# Patient Record
Sex: Female | Born: 1940 | Race: Black or African American | Hispanic: No | Smoking: Never smoker
Health system: Southern US, Community
[De-identification: ages and names within clinical notes are randomized; demographics above are authoritative.]

## PROBLEM LIST (undated history)

## (undated) DIAGNOSIS — I10 Essential (primary) hypertension: Secondary | ICD-10-CM

## (undated) DIAGNOSIS — E7800 Pure hypercholesterolemia, unspecified: Secondary | ICD-10-CM

## (undated) DIAGNOSIS — M1990 Unspecified osteoarthritis, unspecified site: Secondary | ICD-10-CM

## (undated) HISTORY — PX: CORONARY ANGIOPLASTY WITH STENT PLACEMENT: SHX49

## (undated) HISTORY — PX: ABDOMINAL HYSTERECTOMY: SHX81

---

## 2011-11-02 ENCOUNTER — Encounter (HOSPITAL_COMMUNITY): Payer: Self-pay | Admitting: *Deleted

## 2011-11-02 ENCOUNTER — Emergency Department (HOSPITAL_COMMUNITY)
Admission: EM | Admit: 2011-11-02 | Discharge: 2011-11-02 | Disposition: A | Discharge status: Home / Self Care | Payer: PRIVATE HEALTH INSURANCE | Attending: Emergency Medicine | Admitting: Emergency Medicine

## 2011-11-02 DIAGNOSIS — M25539 Pain in unspecified wrist: Secondary | ICD-10-CM | POA: Insufficient documentation

## 2011-11-02 DIAGNOSIS — R609 Edema, unspecified: Secondary | ICD-10-CM | POA: Insufficient documentation

## 2011-11-02 DIAGNOSIS — E7800 Pure hypercholesterolemia, unspecified: Secondary | ICD-10-CM | POA: Insufficient documentation

## 2011-11-02 DIAGNOSIS — Z79899 Other long term (current) drug therapy: Secondary | ICD-10-CM | POA: Insufficient documentation

## 2011-11-02 DIAGNOSIS — R21 Rash and other nonspecific skin eruption: Secondary | ICD-10-CM | POA: Insufficient documentation

## 2011-11-02 DIAGNOSIS — H5710 Ocular pain, unspecified eye: Secondary | ICD-10-CM | POA: Insufficient documentation

## 2011-11-02 DIAGNOSIS — E119 Type 2 diabetes mellitus without complications: Secondary | ICD-10-CM | POA: Insufficient documentation

## 2011-11-02 DIAGNOSIS — I10 Essential (primary) hypertension: Secondary | ICD-10-CM | POA: Insufficient documentation

## 2011-11-02 HISTORY — DX: Pure hypercholesterolemia, unspecified: E78.00

## 2011-11-02 HISTORY — DX: Essential (primary) hypertension: I10

## 2011-11-02 MED ORDER — TRAMADOL HCL 50 MG PO TABS: 50.0000 mg | ORAL_TABLET | Freq: Four times a day (QID) | ORAL | Status: AC | PRN

## 2011-11-02 MED ORDER — HYDROCORTISONE 1 % EX CREA: TOPICAL_CREAM | CUTANEOUS | Status: AC

## 2011-11-02 MED ORDER — POLYMYXIN B-TRIMETHOPRIM 10000-0.1 UNIT/ML-% OP SOLN: 1.0000 [drp] | OPHTHALMIC | Status: AC

## 2011-11-02 NOTE — ED Notes (Addendum)
Pt has rash to left arm, states "it's been 3 weeks." reports rash itches. Also c/o swelling to right wrist and pain. Denies injury, reports had xray on same wrist with no diagnosis. Pt also c/o left eye irritation.

## 2011-11-02 NOTE — ED Provider Notes (Signed)
History     CSN: [id number]  Arrival date & time 11/02/11  1500   First MD Initiated Contact with Patient 11/02/11 1908      Chief Complaint  Patient presents with  . Rash    (Consider location/radiation/quality/duration/timing/severity/associated sxs/prior treatment) HPI Hx from pt. 71 yo F with PMH HTN, DM, hyperlipidemia who presents with several complaints.  She states that she has had a sensation of irritation to her left eye for about the past week and feels as if there is a hair stuck in her eye. She has tried to wash out the eye extensively with water without relief of this sensation. Denies blurry vision, double vision from the eye. Not a contact lens wearer. No known environmental exposures.  She's had a rash to her bilateral arms for the past 3 weeks. Area is itchy. No significant spread since it first appeared. No drainage noted from the areas. No treatment at home prior to arrival. No known aggravating or alleviating factors. Pt does note that this came up after she was out in the yard gardening but is unsure if she came in contact with poison ivy.  Finally, pt states she's had swelling and pain to her R wrist. This has been present for about a month. Swelling located to the radial dorsum. Pain described as aching, worsens with movement. She had an xray done on the wrist while in [place] with family but reports she was not given a diagnosis. Not currently taking any meds at home for this or using ice over the area.   Past Medical History  Diagnosis Date  . Hypertension   . Diabetes mellitus   . Hypercholesteremia     Past Surgical History  Procedure Date  . Abdominal hysterectomy   . Coronary angioplasty with stent placement     History reviewed. No pertinent family history.  History  Substance Use Topics  . Smoking status: Not on file  . Smokeless tobacco: Not on file  . Alcohol Use: No    OB History    Grav Para Term Preterm Abortions TAB SAB Ect Mult  Living                  Review of Systems  HENT: Negative for congestion.   Eyes: Positive for pain. Negative for photophobia, redness and visual disturbance.  Respiratory: Negative for cough and shortness of breath.   Cardiovascular: Negative for chest pain.  Gastrointestinal: Negative for nausea, vomiting and abdominal pain.  Musculoskeletal: Positive for myalgias.  Skin: Positive for rash.  All other systems reviewed and are negative.    Allergies  Review of patient's allergies indicates no known allergies.  Home Medications   Current Outpatient Rx  Name Route Sig Dispense Refill  . AMLODIPINE BESY-BENAZEPRIL HCL 10-20 MG PO CAPS Oral Take 1 capsule by mouth daily.    Marland Kitchen METFORMIN HCL 500 MG PO TABS Oral Take 500 mg by mouth 2 (two) times daily with a meal.    . METOPROLOL SUCCINATE ER 50 MG PO TB24 Oral Take 50 mg by mouth daily. Take with or immediately following a meal.    . SIMVASTATIN 20 MG PO TABS Oral Take 20 mg by mouth daily.      BP 135/82  Pulse 67  Temp 98.2 F (36.8 C) (Oral)  Resp 16  Ht 5\' 3"  (1.6 m)  Wt 180 lb (81.647 kg)  BMI 31.89 kg/m2  SpO2 99%  Physical Exam  Nursing note and vitals reviewed. Constitutional:  She appears well-developed and well-nourished. No distress.  HENT:  Head: Normocephalic and atraumatic.  Right Ear: External ear normal.  Left Ear: External ear normal.  Mouth/Throat: Oropharynx is clear and moist. No oropharyngeal exudate.  Eyes: Conjunctivae and EOM are normal. Pupils are equal, round, and reactive to light.         Increased fluorescein uptake as diagrammed, no obvious FB seen  Neck: Normal range of motion.  Cardiovascular: Normal rate, regular rhythm and normal heart sounds.   Pulmonary/Chest: Effort normal and breath sounds normal. She exhibits no tenderness.  Abdominal: Soft. Bowel sounds are normal. She exhibits no distension. There is no tenderness. There is no rebound and no guarding.  Musculoskeletal: Normal  range of motion.       Mild edema to dorsum of R wrist, area tender to palpation, no deformity, FROM at wrist without pain elicited. Neurovascularly intact with sensory intact to light touch in radian, median, and ulnar distributions. Radial pulse intact. Capillary refill less than 3 seconds.  Neurological: She is alert.  Skin: Skin is warm and dry. Rash noted. Rash is papular. She is not diaphoretic.       Papular rash to bilat forearms, not erythematous appearing, no drainage noted  Psychiatric: She has a normal mood and affect.    ED Course  Procedures (including critical care time)  Labs Reviewed - No data to display No results found.   1. Eye pain   2. Rash   3. Wrist pain       MDM  Pt with multiple complaints - she appears to have a possible small corneal abrasion and has been given polytrim for this. Instructed to f/u with ophtho. Pt also has mild swelling to radial side dorsum of R wrist - possible tendonitis. Given wrist splint. Rash appears poss c/w poison ivy. Will defer systemic pred as pt is diabetic and does not regularly check her sugar. Rx for hydrocortisone. She was instructed to follow up with her primary care doctor for further eval and tx should any of these problems not improve. Reasons to return to ED discussed.        Grant Fontana, PA-C 11/04/11 0130

## 2011-11-02 NOTE — ED Notes (Signed)
Pt verbalizes understanding 

## 2011-11-02 NOTE — Discharge Instructions (Signed)
Your eye pain may be related to a scratch on the surface of your eye. Please take the eye drops as prescribed for the next 5 days.  Your wrist pain is likely related to tendonitis. Please use the wrist splint for comfort and take the pain medication as needed. Follow up with orthopedics if not improving. The rash may be poison ivy. Use the cream for the next several days. Follow up with your doctor if not improving.  RESOURCE GUIDE  Chronic Pain Problems: Contact [facility]  [phone] Patients need to be referred by their primary care doctor.  Insufficient Money for Medicine: Contact United Way:  call "211" or [facility] [phone].  No Primary Care Doctor: - Call [facility]  [phone] - can help you locate a primary care doctor that  accepts your insurance, provides certain services, etc. - Physician Referral Service- [phone]  Agencies that provide inexpensive medical care: - [facility]  [phone] - [facility]  [phone] - [facility]  [phone] - Women's Clinic  [phone] - Planned Parenthood  [phone] [facility]  [phone]  Medicaid-accepting [facility] Providers: - [facility]- [address]  [phone], Mon-Fri 9am-7pm, Sat 9am-1pm - [facility]- [address] , [address] [place]  [phone] - [facility]- [address], [address] [place]  [phone] [facility]- [address]  [phone] - Renaye Rakers- [address] [place], [address], [phone]  Only accepts [place] patients after they have their name  applied to their card  Self Pay (no insurance) in [place]: - Sickle Cell Patients: Dr Willey Blade, [facility]  [address] [place], [phone] - [facility]- [address] [place]  [phone]       [facility]-  [address]       -     [facility]- see information above (Speak to [employer] if you do not have insurance)       -  [facility]- [address] [place], [phone]       -  [facility]- [address],  [phone]       -  [facility]- [address], [phone]       -  Dr Julio Sicks-  [address], [place], [phone]       -  [facility]- [address], [phone]       -  [facility]- [address], [phone], also [address] [phone]       -    [facility]- [address] [place], [phone], 1st & 3rd Saturday   every month, 10am-1pm  1) Find a Doctor and Pay Out of Pocket Although you won't have to find out who is covered by your insurance plan, it is a good idea to ask around and get recommendations. You will then need to call the office and see if the doctor you have chosen will accept you as a new patient and what types of options they offer for patients who are self-pay. Some doctors offer discounts or will set up payment plans for their patients who do not have insurance, but you will need to ask so  you aren't surprised when you get to your appointment.  2) Contact Your Local Health Department Not all health departments have doctors that can see patients for sick visits, but many do, so it is worth a call to see if yours does. If you don't know where your local health department is, you can check in your phone book. The CDC also has a tool to help you locate your state's health department, and many state websites also have listings of all of their local health departments.  3) Find a Walk-in Clinic If your illness is not likely to be very severe or complicated, you may want to try a walk in clinic. These are popping up all over the country in pharmacies, drugstores, and shopping centers. They're usually staffed by nurse practitioners or physician assistants that have been trained  to treat common illnesses and complaints. They're usually fairly quick and inexpensive. However, if you have serious medical issues or chronic medical problems, these are probably not your best option  STD Testing - [facility] Department of [facility], STD Clinic, [address], [place], phone [phone] or [phone].  Monday - Friday, call for an appointment. [facility] Department of [employer], STD Clinic, [address], [place], phone [phone] or [phone].  Monday - Friday, call for an appointment.  Abuse/Neglect: [facility] Child Abuse Hotline 7571361764 [facility] Child Abuse Hotline 770-112-7437 (After Hours)  Emergency Shelter:  [facility] 248-176-5778  Maternity Homes: - [facility] [place] [facility] [phone] - [facility] 8386741888  MRSA Hotline #:   367-252-6293  [facility] Resources  [facility] [place]  [employer] [facility] Dept. [address]                 [address]         [address] [place] [address]  Blondell Reveal Phone:  [phone]                                  Phone:  [phone]                   Phone:  [phone]  [facility] Mental Health, [phone] - [facility] - [phone]       -     [facility] in [place], [address]                                  4587550805, [facility] Child Abuse Hotline 6168325694 or [phone] (After Hours)   Behavioral Health Services  Substance Abuse Resources: - Alcohol and Drug Services  [phone] - [facility] [phone] - [facility] [phone]) [phone] Floydene Flock [phone] - Residential & Outpatient Substance Abuse Program  [phone]  Psychological Services: [facility]  [phone] Services  [phone] - [facility], [place] [address] [place], ACCESS LINE: (828)[phone] or ([phone], [email or web address]  Dental Assistance  If unable to pay or uninsured, contact:  [facility] or [facility]. to become qualified for the adult dental clinic.  Patients with Medicaid: [facility] [phone] [address] Qu[address], [phone] [address], [phone]  If unable to pay, or uninsured, contact [facility] [phone]) or [facility] Department [phone] in [place], [phone] in [facility]) to become qualified for the adult dental clinic  Other Low-Cost Community Dental Services: - [facility]- [address] [place], [place], [place], [address], [phone], Ext. [phone], 2nd and 4th Thursday of the month at 6:30am.  10 clients each day by appointment, can sometimes see walk-in patients if someone does not show for an appointment. [facility]- [address] Ether Griffins [place], [place], [address], [phone] - [facility]- [address], [place], [place], [address], [phone] - [facility]- [phone] [facility]- [phone] [facility] Department- [phone]

## 2011-11-04 NOTE — ED Provider Notes (Signed)
Medical screening examination/treatment/procedure(s) were performed by non-physician practitioner and as supervising physician I was immediately available for consultation/collaboration.  Flint Melter, MD 11/04/11 [id number]

## 2012-03-02 ENCOUNTER — Other Ambulatory Visit: Payer: Self-pay | Admitting: Family Medicine

## 2012-03-02 DIAGNOSIS — N6320 Unspecified lump in the left breast, unspecified quadrant: Secondary | ICD-10-CM

## 2012-03-09 ENCOUNTER — Other Ambulatory Visit: Payer: Self-pay | Admitting: Family Medicine

## 2012-03-09 ENCOUNTER — Ambulatory Visit
Admission: RE | Admit: 2012-03-09 | Discharge: 2012-03-09 | Disposition: A | Discharge status: Home / Self Care | Payer: PRIVATE HEALTH INSURANCE | Source: Ambulatory Visit | Attending: Family Medicine | Admitting: Family Medicine

## 2012-03-09 DIAGNOSIS — N6320 Unspecified lump in the left breast, unspecified quadrant: Secondary | ICD-10-CM

## 2013-10-11 ENCOUNTER — Other Ambulatory Visit: Payer: Self-pay | Admitting: Family Medicine

## 2013-10-11 DIAGNOSIS — M79604 Pain in right leg: Secondary | ICD-10-CM

## 2013-10-11 DIAGNOSIS — M7989 Other specified soft tissue disorders: Secondary | ICD-10-CM

## 2013-10-12 ENCOUNTER — Ambulatory Visit
Admission: RE | Admit: 2013-10-12 | Discharge: 2013-10-12 | Disposition: A | Discharge status: Home / Self Care | Payer: PRIVATE HEALTH INSURANCE | Source: Ambulatory Visit | Attending: Family Medicine | Admitting: Family Medicine

## 2013-10-12 DIAGNOSIS — M79604 Pain in right leg: Secondary | ICD-10-CM

## 2013-10-12 DIAGNOSIS — M7989 Other specified soft tissue disorders: Secondary | ICD-10-CM

## 2013-12-16 ENCOUNTER — Encounter (HOSPITAL_COMMUNITY): Payer: Self-pay | Admitting: Emergency Medicine

## 2013-12-16 ENCOUNTER — Emergency Department (HOSPITAL_COMMUNITY): Payer: PRIVATE HEALTH INSURANCE

## 2013-12-16 ENCOUNTER — Observation Stay (HOSPITAL_COMMUNITY)
Admission: EM | Admit: 2013-12-16 | Discharge: 2013-12-17 | Disposition: A | Discharge status: Home / Self Care | Payer: PRIVATE HEALTH INSURANCE | Attending: Internal Medicine | Admitting: Internal Medicine

## 2013-12-16 DIAGNOSIS — I10 Essential (primary) hypertension: Secondary | ICD-10-CM | POA: Diagnosis not present

## 2013-12-16 DIAGNOSIS — Z79899 Other long term (current) drug therapy: Secondary | ICD-10-CM | POA: Diagnosis not present

## 2013-12-16 DIAGNOSIS — E7800 Pure hypercholesterolemia, unspecified: Secondary | ICD-10-CM | POA: Diagnosis not present

## 2013-12-16 DIAGNOSIS — R079 Chest pain, unspecified: Secondary | ICD-10-CM | POA: Insufficient documentation

## 2013-12-16 DIAGNOSIS — R0789 Other chest pain: Principal | ICD-10-CM | POA: Insufficient documentation

## 2013-12-16 DIAGNOSIS — E119 Type 2 diabetes mellitus without complications: Secondary | ICD-10-CM | POA: Diagnosis not present

## 2013-12-16 DIAGNOSIS — R61 Generalized hyperhidrosis: Secondary | ICD-10-CM | POA: Diagnosis not present

## 2013-12-16 DIAGNOSIS — I2510 Atherosclerotic heart disease of native coronary artery without angina pectoris: Secondary | ICD-10-CM | POA: Diagnosis present

## 2013-12-16 LAB — CBC WITH DIFFERENTIAL/PLATELET
Basophils Absolute: 0 10*3/uL (ref 0.0–0.1)
Basophils Relative: 1 % (ref 0–1)
EOS ABS: 0.1 10*3/uL (ref 0.0–0.7)
Eosinophils Relative: 2 % (ref 0–5)
HCT: 33.7 % — ABNORMAL LOW (ref 36.0–46.0)
HEMOGLOBIN: 11.1 g/dL — AB (ref 12.0–15.0)
LYMPHS ABS: 1.4 10*3/uL (ref 0.7–4.0)
LYMPHS PCT: 22 % (ref 12–46)
MCH: 27.8 pg (ref 26.0–34.0)
MCHC: 32.9 g/dL (ref 30.0–36.0)
MCV: 84.5 fL (ref 78.0–100.0)
MONOS PCT: 5 % (ref 3–12)
Monocytes Absolute: 0.3 10*3/uL (ref 0.1–1.0)
NEUTROS PCT: 70 % (ref 43–77)
Neutro Abs: 4.5 10*3/uL (ref 1.7–7.7)
Platelets: 347 10*3/uL (ref 150–400)
RBC: 3.99 MIL/uL (ref 3.87–5.11)
RDW: 12.6 % (ref 11.5–15.5)
WBC: 6.4 10*3/uL (ref 4.0–10.5)

## 2013-12-16 LAB — GLUCOSE, CAPILLARY
GLUCOSE-CAPILLARY: 170 mg/dL — AB (ref 70–99)
GLUCOSE-CAPILLARY: 73 mg/dL (ref 70–99)
Glucose-Capillary: 99 mg/dL (ref 70–99)

## 2013-12-16 LAB — HEMOGLOBIN A1C
HEMOGLOBIN A1C: 5.2 % (ref <5.7)
MEAN PLASMA GLUCOSE: 103 mg/dL (ref <117)

## 2013-12-16 LAB — I-STAT TROPONIN, ED: TROPONIN I, POC: 0 ng/mL (ref 0.00–0.08)

## 2013-12-16 LAB — TROPONIN I: Troponin I: <0.3 ng/mL (ref <0.30)

## 2013-12-16 LAB — CBC
HCT: 33.7 % — ABNORMAL LOW (ref 36.0–46.0)
HEMOGLOBIN: 11.3 g/dL — AB (ref 12.0–15.0)
MCH: 28.3 pg (ref 26.0–34.0)
MCHC: 33.5 g/dL (ref 30.0–36.0)
MCV: 84.5 fL (ref 78.0–100.0)
Platelets: 335 10*3/uL (ref 150–400)
RBC: 3.99 MIL/uL (ref 3.87–5.11)
RDW: 12.7 % (ref 11.5–15.5)
WBC: 6.5 10*3/uL (ref 4.0–10.5)

## 2013-12-16 LAB — CREATININE, SERUM
Creatinine, Ser: 0.63 mg/dL (ref 0.50–1.10)
GFR calc Af Amer: >90 mL/min (ref >90)
GFR calc non Af Amer: 89 mL/min — ABNORMAL LOW (ref >90)

## 2013-12-16 LAB — BASIC METABOLIC PANEL
Anion gap: 14 (ref 5–15)
BUN: 16 mg/dL (ref 6–23)
CHLORIDE: 103 meq/L (ref 96–112)
CO2: 27 meq/L (ref 19–32)
Calcium: 9.2 mg/dL (ref 8.4–10.5)
Creatinine, Ser: 0.7 mg/dL (ref 0.50–1.10)
GFR calc Af Amer: >90 mL/min (ref >90)
GFR, EST NON AFRICAN AMERICAN: 86 mL/min — AB (ref >90)
GLUCOSE: 98 mg/dL (ref 70–99)
POTASSIUM: 3.6 meq/L — AB (ref 3.7–5.3)
Sodium: 144 mEq/L (ref 137–147)

## 2013-12-16 MED ORDER — ENOXAPARIN SODIUM 40 MG/0.4ML ~~LOC~~ SOLN
40.0000 mg | SUBCUTANEOUS | Status: DC
  Administered 2013-12-16: 40 mg via SUBCUTANEOUS
  Filled 2013-12-16 (×2): qty 0.4

## 2013-12-16 MED ORDER — ASPIRIN 81 MG PO CHEW
324.0000 mg | CHEWABLE_TABLET | Freq: Once | ORAL | Status: AC
  Administered 2013-12-16: 324 mg via ORAL
  Filled 2013-12-16: qty 4

## 2013-12-16 MED ORDER — SIMVASTATIN 20 MG PO TABS
20.0000 mg | ORAL_TABLET | Freq: Every day | ORAL | Status: DC
  Administered 2013-12-16: 20 mg via ORAL
  Filled 2013-12-16 (×2): qty 1

## 2013-12-16 MED ORDER — NITROGLYCERIN 0.4 MG SL SUBL
0.4000 mg | SUBLINGUAL_TABLET | SUBLINGUAL | Status: DC | PRN
  Administered 2013-12-16 (×2): 0.4 mg via SUBLINGUAL
  Filled 2013-12-16: qty 1

## 2013-12-16 MED ORDER — AMLODIPINE BESYLATE 10 MG PO TABS
10.0000 mg | ORAL_TABLET | Freq: Every day | ORAL | Status: DC
  Administered 2013-12-16 – 2013-12-17 (×2): 10 mg via ORAL
  Filled 2013-12-16 (×2): qty 1

## 2013-12-16 MED ORDER — ASPIRIN 325 MG PO TABS
325.0000 mg | ORAL_TABLET | Freq: Every day | ORAL | Status: DC
  Administered 2013-12-17: 325 mg via ORAL
  Filled 2013-12-16: qty 1

## 2013-12-16 MED ORDER — BENAZEPRIL HCL 20 MG PO TABS
20.0000 mg | ORAL_TABLET | Freq: Every day | ORAL | Status: DC
  Administered 2013-12-16 – 2013-12-17 (×2): 20 mg via ORAL
  Filled 2013-12-16 (×2): qty 1

## 2013-12-16 MED ORDER — METOPROLOL SUCCINATE ER 50 MG PO TB24
50.0000 mg | ORAL_TABLET | Freq: Every day | ORAL | Status: DC
  Administered 2013-12-16 – 2013-12-17 (×2): 50 mg via ORAL
  Filled 2013-12-16 (×2): qty 1

## 2013-12-16 MED ORDER — ONDANSETRON HCL 4 MG PO TABS: 4.0000 mg | ORAL_TABLET | Freq: Four times a day (QID) | ORAL | Status: DC | PRN

## 2013-12-16 MED ORDER — AMLODIPINE BESY-BENAZEPRIL HCL 10-20 MG PO CAPS: 1.0000 | ORAL_CAPSULE | Freq: Every day | ORAL | Status: DC

## 2013-12-16 MED ORDER — ACETAMINOPHEN 325 MG PO TABS: 650.0000 mg | ORAL_TABLET | Freq: Four times a day (QID) | ORAL | Status: DC | PRN

## 2013-12-16 MED ORDER — INSULIN ASPART 100 UNIT/ML ~~LOC~~ SOLN
0.0000 [IU] | Freq: Three times a day (TID) | SUBCUTANEOUS | Status: DC
  Administered 2013-12-16: 2 [IU] via SUBCUTANEOUS

## 2013-12-16 MED ORDER — ACETAMINOPHEN 650 MG RE SUPP: 650.0000 mg | Freq: Four times a day (QID) | RECTAL | Status: DC | PRN

## 2013-12-16 MED ORDER — ONDANSETRON HCL 4 MG/2ML IJ SOLN: 4.0000 mg | Freq: Four times a day (QID) | INTRAMUSCULAR | Status: DC | PRN

## 2013-12-16 MED ORDER — SODIUM CHLORIDE 0.9 % IJ SOLN
3.0000 mL | Freq: Two times a day (BID) | INTRAMUSCULAR | Status: DC
  Administered 2013-12-16 (×2): 3 mL via INTRAVENOUS

## 2013-12-16 NOTE — ED Notes (Signed)
Admit Doctor at bedside.  

## 2013-12-16 NOTE — H&P (Signed)
[facility] History and Physical  Kim Bowers ZOX:[id number] DOB: 09/07/1940 DOA: 12/16/2013  Referring physician:  PCP: Leanor Rubenstein, MD  Specialists:   Chief Complaint: chest pain  HPI: Kim Bowers is a 73 y.o. female with PMH of CAD s/p stent, HTN, HPL, DM presented with sudden chest central chest pains, associated with mild SOB, diaphoresis; denies exertional chest pain, or SOB; Pt reported some non productive cough, no fever, chills; no nausea, vomiting or diarrhea   Review of Systems: The patient denies anorexia, fever, weight loss,, vision loss, decreased hearing, hoarseness, chest pain, syncope, dyspnea on exertion, peripheral edema, balance deficits, hemoptysis, abdominal pain, melena, hematochezia, severe indigestion/heartburn, hematuria, incontinence, genital sores, muscle weakness, suspicious skin lesions, transient blindness, difficulty walking, depression, unusual weight change, abnormal bleeding, enlarged lymph nodes, angioedema, and breast masses.    Past Medical History  Diagnosis Date  . Hypertension   . Diabetes mellitus   . Hypercholesteremia    Past Surgical History  Procedure Laterality Date  . Abdominal hysterectomy    . Coronary angioplasty with stent placement     Social History:  reports that she has never smoked. She does not have any smokeless tobacco history on file. She reports that she does not drink alcohol or use illicit drugs. Home;  where does patient live--home, ALF, SNF? and with whom if at home? Yes;  Can patient participate in ADLs?  No Known Allergies  No family history on file.  (be sure to complete)  Prior to Admission medications   Medication Sig Start Date End Date Taking? Authorizing Provider  amLODipine-benazepril (LOTREL) 10-20 MG per capsule Take 1 capsule by mouth daily.   Yes Historical Provider, MD  meloxicam (MOBIC) 15 MG tablet Take 15 mg by mouth daily as needed for pain.   Yes Historical Provider, MD  metFORMIN  (GLUCOPHAGE-XR) 500 MG 24 hr tablet Take 500 mg by mouth 2 (two) times daily.   Yes Historical Provider, MD  metoprolol succinate (TOPROL-XL) 50 MG 24 hr tablet Take 50 mg by mouth daily. Take with or immediately following a meal.   Yes Historical Provider, MD  simvastatin (ZOCOR) 20 MG tablet Take 20 mg by mouth daily.   Yes Historical Provider, MD   Physical Exam: Filed Vitals:   12/16/13 1245  BP: 127/75  Pulse: 64  Temp:   Resp: 16     General:  alert  Eyes: eom-i, perrla   ENT: no oral ulcers   Neck: supple, no JVD   Cardiovascular: s1,s2 rrr  Respiratory: CTA BL  Abdomen: soft, nt,nd   Skin: no rash   Musculoskeletal: no LE edema  Psychiatric: no hallucinations   Neurologic: CN 2-12 intact; motor 5/5 BL  Labs on Admission:  Basic Metabolic Panel:  Recent Labs Lab 12/16/13 1054  NA 144  K 3.6*  CL 103  CO2 27  GLUCOSE 98  BUN 16  CREATININE 0.70  CALCIUM 9.2   Liver Function Tests: No results found for this basename: AST, ALT, ALKPHOS, BILITOT, PROT, ALBUMIN,  in the last 168 hours No results found for this basename: LIPASE, AMYLASE,  in the last 168 hours No results found for this basename: AMMONIA,  in the last 168 hours CBC:  Recent Labs Lab 12/16/13 1054  WBC 6.4  NEUTROABS 4.5  HGB 11.1*  HCT 33.7*  MCV 84.5  PLT 347   Cardiac Enzymes: No results found for this basename: CKTOTAL, CKMB, CKMBINDEX, TROPONINI,  in the last 168 hours  BNP (last 3  results) No results found for this basename: PROBNP,  in the last 8760 hours CBG: No results found for this basename: GLUCAP,  in the last 168 hours  Radiological Exams on Admission: Dg Chest 2 View  12/16/2013   CLINICAL DATA:  Chest pain.  EXAM: CHEST  2 VIEW  COMPARISON:-: COMPARISON:- None available.  FINDINGS: The heart size is normal. The lungs are clear. Mild bronchitic changes are present bilaterally. The visualized soft tissues and bony thorax are unremarkable.  IMPRESSION: Mild  central airway thickening and bronchitic changes bilaterally. May related to reactive airways disease. Nonspecific bronchitis also considered. No focal airspace consolidation is evident.   Electronically Signed   By: Gennette Pachris  Mattern M.D.   On: 12/16/2013 12:06    EKG: Independently reviewed.   Assessment/Plan Active Problems:   Chest pain   CAD (coronary artery disease)   HTN (hypertension)  73 y.o. female with PMH of CAD s/p stent, HTN, HPL, DM presented with sudden chest central chest pains, associated with mild SOB, diaphoresis;  1. Chest pain, atypical; +chest wall tenderness; but significant h/o CAD; no chest pain  -initial trop negative; no previous ECG; monitor serial trop, ECG; start ASA; cont BB, statin;  -consulted cardiology for stress test eval   2. CAD s/p stent history>10 yrs ago; cont BB, statin, added ASA 3. DM no recent ha1c; cont ISS: obtain a1c 4. HTN, cont home regimen   Cardiology;  if consultant consulted, please document name and whether formally or informally consulted  Code Status: full (must indicate code status--if unknown or must be presumed, indicate so) Family Communication:  D/w patient  (indicate person spoken with, if applicable, with phone number if by telephone) Disposition Plan: home 24-48 hrs  (indicate anticipated LOS)  Time spent: >35 minutes   Esperanza SheetsBURIEV, Labaron Digirolamo N [facility] Pager [phone]  If 7PM-7AM, please contact night-coverage [email or web address] Password [email or web address] 12/16/2013, 1:14 PM

## 2013-12-16 NOTE — ED Notes (Signed)
Pt returned from xray

## 2013-12-16 NOTE — ED Notes (Signed)
Lt. Side chest pain began yesterday, intermittent and sharp.  When the pain would come pt. Was unable to move.  Denies any n/v/ sob or dizziness.  Skin is w/d, resp. E/u

## 2013-12-16 NOTE — ED Notes (Signed)
Patient denies pain 0/10 however states left sided chest heavy feeling 2/10. Denies shortness of breath.

## 2013-12-16 NOTE — ED Notes (Signed)
Pt transported to X-Ray with X-ray tech.

## 2013-12-16 NOTE — ED Provider Notes (Signed)
TIME SEEN: 10:41 AM  CHIEF COMPLAINT: Chest pain, diaphoresis  HPI: Patient is a 73 year old female with history of hypertension, diabetes, hyperlipidemia, CAD [place] stent [place] 2001 [place] who presents emergency department with left-sided chest pain that has been intermittent for several months but worse over the past several days. She describes as a heaviness in her chest. She states she's also had one week of diaphoresis with exertion and now at rest. She states when she had to have a stent placed she was having diaphoresis with exertion and no other symptoms. She denies any shortness of breath, nausea, vomiting or diarrhea, fever or cough. She [place]ologist [place]. Her PCP is Dr. Wynelle LinkSun.  ROS: See HPI Constitutional: no fever  Eyes: no drainage  ENT: no runny nose   Cardiovascular:   chest pain  Resp: no SOB  GI: no vomiting GU: no dysuria Integumentary: no rash  Allergy: no hives  Musculoskeletal: no leg swelling  Neurological: no slurred speech ROS otherwise negative  PAST MEDICAL HISTORY/PAST SURGICAL HISTORY:  Past Medical History  Diagnosis Date  . Hypertension   . Diabetes mellitus   . Hypercholesteremia     MEDICATIONS:  Prior to Admission medications   Medication Sig Start Date End Date Taking? Authorizing Provider  amLODipine-benazepril (LOTREL) 10-20 MG per capsule Take 1 capsule by mouth daily.   Yes Historical Provider, MD  meloxicam (MOBIC) 15 MG tablet Take 15 mg by mouth daily as needed for pain.   Yes Historical Provider, MD  metFORMIN (GLUCOPHAGE-XR) 500 MG 24 hr tablet Take 500 mg by mouth 2 (two) times daily.   Yes Historical Provider, MD  metoprolol succinate (TOPROL-XL) 50 MG 24 hr tablet Take 50 mg by mouth daily. Take with or immediately following a meal.   Yes Historical Provider, MD  simvastatin (ZOCOR) 20 MG tablet Take 20 mg by mouth daily.   Yes Historical Provider, MD    ALLERGIES:  No Known Allergies  SOCIAL  HISTORY:  History  Substance Use Topics  . Smoking status: Never Smoker   . Smokeless tobacco: Not on file  . Alcohol Use: No    FAMILY HISTORY: No family history on file.  EXAM: BP 179/84  Pulse 60  Temp(Src) 97.9 F (36.6 C) (Oral)  Resp 16  Ht 5\' 3"  (1.6 m)  Wt 165 lb (74.844 kg)  BMI 29.24 kg/m2  SpO2 100% CONSTITUTIONAL: Alert and oriented and responds appropriately to questions. Well-appearing; well-nourished HEAD: Normocephalic EYES: Conjunctivae clear, PERRL ENT: normal nose; no rhinorrhea; moist mucous membranes; pharynx without lesions noted NECK: Supple, no meningismus, no LAD  CARD: RRR; S1 and S2 appreciated; no murmurs, no clicks, no rubs, no gallops RESP: Normal chest excursion without splinting or tachypnea; breath sounds clear and equal bilaterally; no wheezes, no rhonchi, no rales,  ABD/GI: Normal bowel sounds; non-distended; soft, non-tender, no rebound, no guarding BACK:  The back appears normal and is non-tender to palpation, there is no CVA tenderness EXT: Normal ROM in all joints; non-tender to palpation; no edema; normal capillary refill; no cyanosis    SKIN: Normal color for age and race; warm NEURO: Moves all extremities equally PSYCH: The patient's mood and manner are appropriate. Grooming and personal hygiene are appropriate.  MEDICAL DECISION MAKING: Patient here with chest pain and diaphoresis. She is a very poor historian and history is limited. Concerned however because patient has a history of CAD and states that when she had her prior stent  place her only symptom was diaphoresis. Given she is having similar symptoms this week, he feel she needs evaluation for ACS. We'll give aspirin, nitroglycerin, obtain cardiac labs and chest x-ray. Her EKG shows Q waves in anterior leads but there is no old for comparison. I feel she will need admission for ACS rule out.  ED PROGRESS: Troponin negative. Chest x-ray clear. Discussed with hospitalist for  admission for ACS rule out to observation, telemetry bed.     EKG Interpretation  Date/Time:  Saturday December 16 2013 09:48:45 EDT Ventricular Rate:  60 PR Interval:  150 QRS Duration: 80 QT Interval:  416 QTC Calculation: 416 R Axis:   19 Text Interpretation:  Sinus rhythm with occasional Premature ventricular complexes Cannot rule out Anterior infarct , age undetermined Abnormal ECG No old for comparison Confirmed by WARD,  DO, KRISTEN [phone]) on 12/16/2013 10:45:10 AM        Layla Maw Ward, DO 12/16/13 1228

## 2013-12-17 DIAGNOSIS — I10 Essential (primary) hypertension: Secondary | ICD-10-CM | POA: Diagnosis not present

## 2013-12-17 DIAGNOSIS — R079 Chest pain, unspecified: Secondary | ICD-10-CM

## 2013-12-17 DIAGNOSIS — R61 Generalized hyperhidrosis: Secondary | ICD-10-CM | POA: Diagnosis not present

## 2013-12-17 DIAGNOSIS — R0789 Other chest pain: Secondary | ICD-10-CM | POA: Diagnosis not present

## 2013-12-17 LAB — GLUCOSE, CAPILLARY
GLUCOSE-CAPILLARY: 95 mg/dL (ref 70–99)
Glucose-Capillary: 94 mg/dL (ref 70–99)

## 2013-12-17 LAB — TROPONIN I: Troponin I: <0.3 ng/mL (ref <0.30)

## 2013-12-17 MED ORDER — SIMVASTATIN 20 MG PO TABS: 20.0000 mg | ORAL_TABLET | Freq: Every day | ORAL | Status: AC

## 2013-12-17 MED ORDER — AMLODIPINE BESY-BENAZEPRIL HCL 10-20 MG PO CAPS: 1.0000 | ORAL_CAPSULE | Freq: Every day | ORAL | Status: AC

## 2013-12-17 MED ORDER — ACETAMINOPHEN 325 MG PO TABS: 650.0000 mg | ORAL_TABLET | Freq: Four times a day (QID) | ORAL | Status: AC | PRN

## 2013-12-17 MED ORDER — METOPROLOL SUCCINATE ER 50 MG PO TB24: 50.0000 mg | ORAL_TABLET | Freq: Every day | ORAL | Status: AC

## 2013-12-17 MED ORDER — ASPIRIN EC 81 MG PO TBEC: 81.0000 mg | DELAYED_RELEASE_TABLET | Freq: Every day | ORAL | Status: AC

## 2013-12-17 MED ORDER — METFORMIN HCL ER 500 MG PO TB24: 500.0000 mg | ORAL_TABLET | Freq: Two times a day (BID) | ORAL | Status: DC

## 2013-12-17 NOTE — Progress Notes (Signed)
UR Completed.  Adel Burch Jane [phone] 12/17/2013  

## 2013-12-17 NOTE — Discharge Instructions (Signed)
Please follow up with primary care doctor in 1 week to scheduled stress test  Please follow up with cardiology in 1 week

## 2013-12-17 NOTE — Discharge Summary (Signed)
Physician Discharge Summary  Kim Bowers ZOX:[id number] DOB: Apr 21, 1941 DOA: 12/16/2013  PCP: Leanor Rubenstein, MD  Admit date: 12/16/2013 Discharge date: 12/17/2013  Time spent: >35 minutes  Recommendations for Outpatient Follow-up:  F/u with PCP in 1 week  F/u with cardiology in 1 week to scheduled stress test   Discharge Diagnoses:  Active Problems:   Chest pain   CAD (coronary artery disease)   HTN (hypertension)   Discharge Condition: stable   Diet recommendation: low sodium, DM  Filed Weights   12/16/13 0954  Weight: 74.844 kg (165 lb)    History of present illness:  73 y.o. female with PMH of CAD s/p stent, HTN, HPL, DM presented with sudden chest central chest pains, associated with mild SOB, diaphoresis;    Hospital Course:  1. Chest pain, atypical; +chest wall tenderness;  -chest pain resolved; trop negative; ACS ruled out; started ASA; cont BB, statin;  -consulted cardiology: recommended OP stress test; d/w patient  2. CAD s/p stent history>10 yrs ago; cont BB, statin, added ASA  3. DM no recent ha1c; cont ISS: obtain a1c -5.2 4. HTN, cont home regimen   Procedures:  none (i.e. Studies not automatically included, echos, thoracentesis, etc; not x-rays)  Consultations:  Cardiology   Discharge Exam: Filed Vitals:   12/17/13 0548  BP: 115/64  Pulse: 53  Temp: 97.9 F (36.6 C)  Resp: 15    General: alert Cardiovascular: s1,s2 rrr Respiratory: CTA BL  Discharge Instructions  Discharge Instructions   Diet - low sodium heart healthy    Complete by:  As directed      Discharge instructions    Complete by:  As directed   Please follow up with primary care doctor in 1 week to scheduled stress test  Please follow up with cardiology in 1 week     Increase activity slowly    Complete by:  As directed             Medication List         acetaminophen 325 MG tablet  Commonly known as:  TYLENOL  Take 2 tablets (650 mg total) by mouth every 6 (six)  hours as needed for mild pain (or Fever >/= 101).     amLODipine-benazepril 10-20 MG per capsule  Commonly known as:  LOTREL  Take 1 capsule by mouth daily.     aspirin EC 81 MG tablet  Take 1 tablet (81 mg total) by mouth daily.     meloxicam 15 MG tablet  Commonly known as:  MOBIC  Take 15 mg by mouth daily as needed for pain.     metFORMIN 500 MG 24 hr tablet  Commonly known as:  GLUCOPHAGE-XR  Take 1 tablet (500 mg total) by mouth 2 (two) times daily.     metoprolol succinate 50 MG 24 hr tablet  Commonly known as:  TOPROL-XL  Take 1 tablet (50 mg total) by mouth daily. Take with or immediately following a meal.     simvastatin 20 MG tablet  Commonly known as:  ZOCOR  Take 1 tablet (20 mg total) by mouth daily.       No Known Allergies     Follow-up Information   Follow up with Leanor Rubenstein, MD. Schedule an appointment as soon as possible for a visit in 1 week.   Specialty:  Family Medicine   Contact information:   [address] [place] [address] [phone]       Follow up  with Cassell Clementhomas Brackbill, MD. Schedule an appointment as soon as possible for a visit in 1 week.   Specialty:  Cardiology   Contact information:   [address] [place] [address] [phone]        The results of significant diagnostics from this hospitalization (including imaging, microbiology, ancillary and laboratory) are listed below for reference.    Significant Diagnostic Studies: Dg Chest 2 View  12/16/2013   CLINICAL DATA:  Chest pain.  EXAM: CHEST  2 VIEW  COMPARISON:-: COMPARISON:- None available.  FINDINGS: The heart size is normal. The lungs are clear. Mild bronchitic changes are present bilaterally. The visualized soft tissues and bony thorax are unremarkable.  IMPRESSION: Mild central airway thickening and bronchitic changes bilaterally. May related to reactive airways disease. Nonspecific bronchitis also considered. No focal airspace  consolidation is evident.   Electronically Signed   By: Gennette Pachris  Mattern M.D.   On: 12/16/2013 12:06    Microbiology: No results found for this or any previous visit (from the past 240 hour(s)).   Labs: Basic Metabolic Panel:  Recent Labs Lab 12/16/13 1054 12/16/13 1405  NA 144  --   K 3.6*  --   CL 103  --   CO2 27  --   GLUCOSE 98  --   BUN 16  --   CREATININE 0.70 0.63  CALCIUM 9.2  --    Liver Function Tests: No results found for this basename: AST, ALT, ALKPHOS, BILITOT, PROT, ALBUMIN,  in the last 168 hours No results found for this basename: LIPASE, AMYLASE,  in the last 168 hours No results found for this basename: AMMONIA,  in the last 168 hours CBC:  Recent Labs Lab 12/16/13 1054 12/16/13 1405  WBC 6.4 6.5  NEUTROABS 4.5  --   HGB 11.1* 11.3*  HCT 33.7* 33.7*  MCV 84.5 84.5  PLT 347 335   Cardiac Enzymes:  Recent Labs Lab 12/16/13 1405 12/16/13 1825 12/17/13 0128  TROPONINI <0.30 <0.30 <0.30   BNP: BNP (last 3 results) No results found for this basename: PROBNP,  in the last 8760 hours CBG:  Recent Labs Lab 12/16/13 1337 12/16/13 1658 12/16/13 2020 12/17/13 0710  GLUCAP 99 170* 73 95       Signed:  Ameliah Baskins N  [facility] 12/17/2013, 11:19 AM

## 2013-12-17 NOTE — Consult Note (Signed)
CARDIOLOGY CONSULT NOTE   Patient ID: Kim Bowers MRN: [id number], DOB/AGE: 01/04/1941   Admit date: 12/16/2013 Date of Consult: 12/17/2013   Primary Physician: Leanor RubensteinSUN,VYVYAN Y, MD Primary Cardiologist: None  Pt. Profile  73 year old woman with hypertension hypercholesterol and diabetes admitted with chest pain.  Problem List  Past Medical History  Diagnosis Date  . Hypertension   . Diabetes mellitus   . Hypercholesteremia     Past Surgical History  Procedure Laterality Date  . Abdominal hysterectomy    . Coronary angioplasty with stent placement       Allergies  No Known Allergies  HPI   This pleasant 73 year old woman was admitted on 12/16/13 with chest pain.  She has a past history of coronary artery disease.  She had a stent to her heart [facility] [place] [facility] [place] about 13 years ago.  Since then she has not been experiencing any exertional chest pain or angina.  She is on disability for her medical problems.  She has been taking her medications as directed.  Inpatient Medications  . amLODipine  10 mg Oral Daily   And  . benazepril  20 mg Oral Daily  . aspirin  325 mg Oral Daily  . enoxaparin (LOVENOX) injection  40 mg Subcutaneous Q24H  . insulin aspart  0-9 Units Subcutaneous TID WC  . metoprolol succinate  50 mg Oral Daily  . simvastatin  20 mg Oral q1800  . sodium chloride  3 mL Intravenous Q12H    Family History No family history on file.  The patient's mother died of a heart attack in her 6850s.   Social History History   Social History  . Marital Status: Widowed    Spouse Name: N/A    Number of Children: N/A  . Years of Education: N/A   Occupational History  . Not on file.   Social History Main Topics  . Smoking status: Never Smoker   . Smokeless tobacco: Not on file  . Alcohol Use: No  . Drug Use: No  . Sexual Activity: Not on file   Other Topics Concern  . Not on file   Social History Narrative  . No  narrative on file     Review of Systems  General:  No chills, fever, night sweats or weight changes.  Cardiovascular:  No chest pain, dyspnea on exertion, edema, orthopnea, palpitations, paroxysmal nocturnal dyspnea. Dermatological: No rash, lesions/masses Respiratory: No cough, dyspnea Urologic: No hematuria, dysuria Abdominal:   No nausea, vomiting, diarrhea, bright red blood per rectum, melena, or hematemesis Neurologic:  No visual changes, wkns, changes in mental status. All other systems reviewed and are otherwise negative except as noted above.  Physical Exam  Blood pressure 115/64, pulse 53, temperature 97.9 F (36.6 C), temperature source Oral, resp. rate 15, height 5\' 3"  (1.6 m), weight 165 lb (74.844 kg), SpO2 100.00%.  General: Pleasant, NAD Psych: Normal affect. Neuro: Alert and oriented X 3. Moves all extremities spontaneously. HEENT: Normal  Neck: Supple without bruits or JVD. Lungs:  Resp regular and unlabored, CTA. Heart: RRR no s3, s4, or murmurs. Abdomen: Soft, non-tender, non-distended, BS + x 4.  Extremities: No clubbing, cyanosis or edema. DP/PT/Radials 2+ and equal bilaterally.  Labs   Recent Labs  12/16/13 1405 12/16/13 1825 12/17/13 0128  TROPONINI <0.30 <0.30 <0.30   Lab Results  Component Value Date   WBC 6.5 12/16/2013   HGB 11.3* 12/16/2013   HCT 33.7* 12/16/2013   MCV  84.5 12/16/2013   PLT 335 12/16/2013     Recent Labs Lab 12/16/13 1054 12/16/13 1405  NA 144  --   K 3.6*  --   CL 103  --   CO2 27  --   BUN 16  --   CREATININE 0.70 0.63  CALCIUM 9.2  --   GLUCOSE 98  --    No results found for this basename: CHOL,  HDL,  LDLCALC,  TRIG   No results found for this basename: DDIMER    Radiology/Studies  Dg Chest 2 View  12/16/2013   CLINICAL DATA:  Chest pain.  EXAM: CHEST  2 VIEW  COMPARISON:-: COMPARISON:- None available.  FINDINGS: The heart size is normal. The lungs are clear. Mild bronchitic changes are present bilaterally. The  visualized soft tissues and bony thorax are unremarkable.  IMPRESSION: Mild central airway thickening and bronchitic changes bilaterally. May related to reactive airways disease. Nonspecific bronchitis also considered. No focal airspace consolidation is evident.   Electronically Signed   By: Gennette Pac M.D.   On: 12/16/2013 12:06    ECG  Normal sinus rhythm  ASSESSMENT AND PLAN  1. chest pain.  She has ruled out for myocardial infarction.  Enzymes are negative and EKG is normal.  She has a remote history of coronary artery disease with previous stent, details not known. 2. hypertensive heart disease without heart failure 3. Hypercholesterolemia 4. diabetes mellitus adult onset 5. osteoarthritis of the right knee  Disposition: Nuclear medicine is unable to get her on the schedule for today.  She would benefit from a Lexi scan Myoview stress.  This can be done on an outpatient basis at our office. Okay for discharge home later today from cardiology standpoint. Signed, Cassell Clement, MD  12/17/2013, 10:29 AM

## 2014-01-29 ENCOUNTER — Ambulatory Visit (INDEPENDENT_AMBULATORY_CARE_PROVIDER_SITE_OTHER): Payer: PRIVATE HEALTH INSURANCE | Admitting: Cardiology

## 2014-01-29 ENCOUNTER — Encounter: Payer: Self-pay | Admitting: Cardiology

## 2014-01-29 VITALS — BP 130/84 | HR 62 | Ht 63.0 in | Wt 173.0 lb

## 2014-01-29 DIAGNOSIS — E7800 Pure hypercholesterolemia, unspecified: Secondary | ICD-10-CM

## 2014-01-29 DIAGNOSIS — IMO0001 Reserved for inherently not codable concepts without codable children: Secondary | ICD-10-CM | POA: Insufficient documentation

## 2014-01-29 DIAGNOSIS — I119 Hypertensive heart disease without heart failure: Secondary | ICD-10-CM

## 2014-01-29 DIAGNOSIS — E1165 Type 2 diabetes mellitus with hyperglycemia: Principal | ICD-10-CM

## 2014-01-29 DIAGNOSIS — I259 Chronic ischemic heart disease, unspecified: Secondary | ICD-10-CM | POA: Insufficient documentation

## 2014-01-29 DIAGNOSIS — M1710 Unilateral primary osteoarthritis, unspecified knee: Secondary | ICD-10-CM | POA: Insufficient documentation

## 2014-01-29 NOTE — Progress Notes (Signed)
Kim Bowers Date of Birth:  01-15-1941 [facility] [address] [place], [place]  [address] [phone]        Fax   [phone]   History of Present Illness: This pleasant 73 year old woman is seen for a posthospital office visit.  We had seen her in consultation during her hospital stay of August 2015.  She was admitted at that time with chest pain.  She ruled out for a myocardial infarction.  Myoview stress test was planned but could not be done on an inpatient basis and was therefore felt to be indicated to be done subsequently on an outpatient basis.  Patient has a history of remote coronary artery disease.  She had a previous stent placed in her heart at [facility] in [place] about 13 years ago.  She has a past history of hypertension hypercholesterolemia and she has been diabetic for about 15 years.  She has never smoked cigarettes. She is on disability for her medical problems. Recently she has been having worsening problems with right knee pain.  She has seen an orthopedist here in [place] but cannot remember whom she saw.  Her medical doctor is Dr. Wynelle Link. History reveals that her mother died of a heart attack in her 45s.  Current Outpatient Prescriptions  Medication Sig Dispense Refill  . acetaminophen (TYLENOL) 325 MG tablet Take 2 tablets (650 mg total) by mouth every 6 (six) hours as needed for mild pain (or Fever >/= 101).      Marland Kitchen amLODipine-benazepril (LOTREL) 10-20 MG per capsule Take 1 capsule by mouth daily.  30 capsule  1  . aspirin EC 81 MG tablet Take 1 tablet (81 mg total) by mouth daily.      . metFORMIN (GLUCOPHAGE-XR) 500 MG 24 hr tablet Take 500 mg by mouth daily with breakfast.      . metoprolol succinate (TOPROL-XL) 50 MG 24 hr tablet Take 1 tablet (50 mg total) by mouth daily. Take with or immediately following a meal.  30 tablet  1  . simvastatin (ZOCOR) 20 MG tablet Take 1 tablet (20 mg total) by mouth daily.   30 tablet  1   No current facility-administered medications for this visit.    No Known Allergies  Patient Active Problem List   Diagnosis Date Noted  . Type II or unspecified type diabetes mellitus without mention of complication, uncontrolled 01/29/2014  . Pure hypercholesterolemia 01/29/2014  . Ischemic heart disease 01/29/2014  . Osteoarthritis of knee, unilateral 01/29/2014  . Chest pain 12/16/2013  . CAD (coronary artery disease) 12/16/2013  . HTN (hypertension) 12/16/2013    History  Smoking status  . Never Smoker   Smokeless tobacco  . Not on file    History  Alcohol Use No    No family history on file.mother died of a heart attack in her 43s.  Review of Systems: Constitutional: no fever chills diaphoresis or fatigue or change in weight.  Head and neck: no hearing loss, no epistaxis, no photophobia or visual disturbance. Respiratory: No cough, shortness of breath or wheezing. Cardiovascular: No chest pain peripheral edema, palpitations. Gastrointestinal: No abdominal distention, no abdominal pain, no change in bowel habits hematochezia or melena. Genitourinary: No dysuria, no frequency, no urgency, no nocturia. Musculoskeletal:No arthralgias, no back pain, no gait disturbance or myalgias.  Recent severe right knee swelling and pain  Neurological: No dizziness, no headaches, no numbness, no seizures, no syncope, no weakness, no tremors. Hematologic:  No lymphadenopathy, no easy bruising. Psychiatric: No confusion, no hallucinations, no sleep disturbance.    Physical Exam: Filed Vitals:   01/29/14 1322  BP: 130/84  Pulse: 62  The patient appears to be in no distress.  Head and neck exam reveals that the pupils are equal and reactive.  The extraocular movements are full.  There is no scleral icterus.  Mouth and pharynx are benign.  No lymphadenopathy.  No carotid bruits.  The jugular venous pressure is normal.  Thyroid is not enlarged or tender.  Chest is  clear to percussion and auscultation.  No rales or rhonchi.  Expansion of the chest is symmetrical.  Heart reveals no abnormal lift or heave.  First and second heart sounds are normal.  There is no murmur gallop rub or click.  The abdomen is soft and nontender.  Bowel sounds are normoactive.  There is no hepatosplenomegaly or mass.  There are no abdominal bruits.  Extremities reveal no phlebitis or edema.  Pedal pulses are good.  There is no cyanosis or clubbing.  The right knee is swollen and tender.  Neurologic exam is normal strength and no lateralizing weakness.  No sensory deficits.  Integument reveals no rash  EKG shows normal sinus rhythm with minimal voltage criteria for LVH.  Since 12/16/13, there is no significant change  Assessment / Plan: 1. ischemic heart disease with history of stent to coronary artery about 13 years ago in [place].  Details not available. 2. chest pain, atypical, with myocardial infarction ruled out at recent hospital admission in August 2015 3. hypertensive heart disease without heart failure 4. Hypercholesterolemia 5.  Diabetes mellitus 6.  Osteoarthritis of right knee  Plan: return for a LexiScan Myoview stress test.  Followup with her primary care physician or her orthopedist regarding her ongoing right knee.  Return here when necessary. Many thanks for the opportunity to see this pleasant woman with you.  We will be in touch regarding the results of her stress test.

## 2014-01-29 NOTE — Patient Instructions (Signed)
Your physician recommends that you continue on your current medications as directed. Please refer to the Current Medication list given to you today.  Your physician has requested that you have a lexiscan myoview. For further information please visit [email or web address]. Please follow instruction sheet, as given.  Follow up as needed  

## 2014-02-07 ENCOUNTER — Ambulatory Visit (HOSPITAL_COMMUNITY): Payer: PRIVATE HEALTH INSURANCE | Attending: Cardiovascular Disease | Admitting: Radiology

## 2014-02-07 VITALS — BP 147/78 | Ht 63.0 in | Wt 170.0 lb

## 2014-02-07 DIAGNOSIS — R079 Chest pain, unspecified: Secondary | ICD-10-CM | POA: Insufficient documentation

## 2014-02-07 DIAGNOSIS — I10 Essential (primary) hypertension: Secondary | ICD-10-CM | POA: Insufficient documentation

## 2014-02-07 DIAGNOSIS — E1165 Type 2 diabetes mellitus with hyperglycemia: Secondary | ICD-10-CM

## 2014-02-07 DIAGNOSIS — E119 Type 2 diabetes mellitus without complications: Secondary | ICD-10-CM | POA: Diagnosis not present

## 2014-02-07 DIAGNOSIS — I119 Hypertensive heart disease without heart failure: Secondary | ICD-10-CM

## 2014-02-07 DIAGNOSIS — I2510 Atherosclerotic heart disease of native coronary artery without angina pectoris: Secondary | ICD-10-CM

## 2014-02-07 DIAGNOSIS — IMO0001 Reserved for inherently not codable concepts without codable children: Secondary | ICD-10-CM

## 2014-02-07 DIAGNOSIS — I259 Chronic ischemic heart disease, unspecified: Secondary | ICD-10-CM

## 2014-02-07 DIAGNOSIS — E7800 Pure hypercholesterolemia, unspecified: Secondary | ICD-10-CM

## 2014-02-07 MED ORDER — REGADENOSON 0.4 MG/5ML IV SOLN
0.4000 mg | Freq: Once | INTRAVENOUS | Status: AC
  Administered 2014-02-07: 0.4 mg via INTRAVENOUS

## 2014-02-07 MED ORDER — TECHNETIUM TC 99M SESTAMIBI GENERIC - CARDIOLITE
10.0000 | Freq: Once | INTRAVENOUS | Status: AC | PRN
  Administered 2014-02-07: 10 via INTRAVENOUS

## 2014-02-07 MED ORDER — TECHNETIUM TC 99M SESTAMIBI GENERIC - CARDIOLITE
30.0000 | Freq: Once | INTRAVENOUS | Status: AC | PRN
  Administered 2014-02-07: 30 via INTRAVENOUS

## 2014-02-07 NOTE — Progress Notes (Signed)
[facility] S[facility] NUCLEAR MED [address] [place], [place] [address] [phone]    Cardiology Nuclear Med Study  Jacqulyn CaneMattie Willa RoughHicks is a 73 y.o. female     MRN : [id number]     DOB: 1940/06/10  Procedure Date: 02/07/2014  Nuclear Med Background Indication for Stress Test:  Evaluation for Ischemia and Stent Patency History:  CAD; Cardiac Risk Factors: Hypertension and NIDDM  Symptoms:  Chest Pain and "heaviness under left breast" last episode this am   Nuclear Pre-Procedure Caffeine/Decaff Intake:  None> 12 hrs NPO After: 10:00am   Lungs:  clear O2 Sat: 96% on room air. IV 0.9% NS with Angio Cath:  22g  IV Site: R Antecubital x 1, tolerated well IV Started by:  Irean HongPatsy Edwards, RN  Chest Size (in):  36 Cup Size: C  Height: 5\' 3"  (1.6 m)  Weight:  170 lb (77.111 kg)  BMI:  Body mass index is 30.12 kg/(m^2). Tech Comments:  No metformin today, and held Toprol x 48 hrs per patient. Irean HongPatsy Edwards, RN.    Nuclear Med Study 1 or 2 day study: 1 day  Stress Test Type:  Eugenie BirksLexiscan  Reading MD: N/A  Order Authorizing Provider:  Cassell Clementhomas Brackbill, MD  Resting Radionuclide: Technetium 7130m Sestamibi  Resting Radionuclide Dose: 11.0 mCi   Stress Radionuclide:  Technetium 4230m Sestamibi  Stress Radionuclide Dose: 33.0 mCi           Stress Protocol Rest HR: 57 Stress HR: 85  Rest BP: 147/78 Stress BP: 218/73  Exercise Time (min): n/a METS: n/a   Predicted Max HR: 150 bpm % Max HR: 56.67 bpm Rate Pressure Product: [id number]   Dose of Adenosine (mg):  n/a Dose of Lexiscan: 0.4 mg  Dose of Atropine (mg): n/a Dose of Dobutamine: n/a mcg/kg/min (at max HR)  Stress Test Technologist: Frederick Peerseresa Johnson, EMT-P  Nuclear Technologist:  Kerby NoraElzbieta Kubak, CNMT     Rest Procedure:  Myocardial perfusion imaging was performed at rest 45 minutes following the intravenous administration of Technetium 6430m Sestamibi. Rest ECG: NSR - Normal EKG  Stress Procedure:  The patient received IV Lexiscan 0.4  mg over 15-seconds.  Technetium 5030m Sestamibi injected at 30-seconds.  Quantitative spect images were obtained after a 45 minute delay. Stress ECG: No significant change from baseline ECG, PVCs  QPS Raw Data Images:  Normal; no motion artifact; normal heart/lung ratio. Stress Images:  Normal homogeneous uptake in all areas of the myocardium. Rest Images:  Normal homogeneous uptake in all areas of the myocardium. Subtraction (SDS):  No evidence of ischemia. Transient Ischemic Dilatation (Normal <1.22):  1.05 Lung/Heart Ratio (Normal <0.45):  0.28  Quantitative Gated Spect Images QGS EDV:  90 ml QGS ESV:  31 ml  Impression Exercise Capacity:  Lexiscan with no exercise. BP Response:  Hypertensive blood pressure response. Clinical Symptoms:  No significant symptoms noted. ECG Impression:  No significant ST segment change suggestive of ischemia. Comparison with Prior Nuclear Study: No images to compare  Overall Impression:  Normal stress nuclear study.  LV Ejection Fraction: 65%.  LV Wall Motion:  NL LV Function; NL Wall Motion  Lars MassonELSON, Bellah Alia H 02/07/2014

## 2014-02-12 ENCOUNTER — Telehealth: Payer: Self-pay | Admitting: *Deleted

## 2014-02-12 NOTE — Telephone Encounter (Signed)
Message copied by Burnell BlanksPRATT, Semaj Coburn B on Mon Feb 12, 2014  5:14 PM ------      Message from: Cassell ClementBRACKBILL, THOMAS      Created: Thu Feb 08, 2014  8:05 AM       Please report.  The stress test is normal.  No ischemia.  Good LV function.  Please send a copy to Dr. Wynelle LinkSun ------

## 2014-02-12 NOTE — Telephone Encounter (Signed)
Advised patient of results.  

## 2018-05-23 ENCOUNTER — Other Ambulatory Visit: Payer: Self-pay | Admitting: Internal Medicine

## 2018-05-23 DIAGNOSIS — Z13820 Encounter for screening for osteoporosis: Secondary | ICD-10-CM

## 2018-05-23 DIAGNOSIS — N6459 Other signs and symptoms in breast: Secondary | ICD-10-CM

## 2018-07-15 ENCOUNTER — Other Ambulatory Visit: Payer: Self-pay

## 2018-08-10 DEATH — deceased

## 2019-08-14 ENCOUNTER — Other Ambulatory Visit: Payer: Self-pay | Admitting: Family

## 2019-08-14 DIAGNOSIS — Z13820 Encounter for screening for osteoporosis: Secondary | ICD-10-CM

## 2019-10-25 ENCOUNTER — Ambulatory Visit
Admission: RE | Admit: 2019-10-25 | Discharge: 2019-10-25 | Disposition: A | Discharge status: Home / Self Care | Payer: Medicare Other | Source: Ambulatory Visit | Attending: Family | Admitting: Family

## 2019-10-25 ENCOUNTER — Other Ambulatory Visit: Payer: Self-pay

## 2019-10-25 DIAGNOSIS — Z13820 Encounter for screening for osteoporosis: Secondary | ICD-10-CM

## 2019-10-28 ENCOUNTER — Emergency Department (HOSPITAL_COMMUNITY)
Admission: EM | Admit: 2019-10-28 | Discharge: 2019-10-28 | Disposition: A | Discharge status: Home / Self Care | Payer: Medicare Other | Attending: Emergency Medicine | Admitting: Emergency Medicine

## 2019-10-28 ENCOUNTER — Encounter (HOSPITAL_COMMUNITY): Payer: Self-pay | Admitting: Emergency Medicine

## 2019-10-28 ENCOUNTER — Other Ambulatory Visit: Payer: Self-pay

## 2019-10-28 DIAGNOSIS — Z79899 Other long term (current) drug therapy: Secondary | ICD-10-CM | POA: Diagnosis not present

## 2019-10-28 DIAGNOSIS — E119 Type 2 diabetes mellitus without complications: Secondary | ICD-10-CM | POA: Insufficient documentation

## 2019-10-28 DIAGNOSIS — I10 Essential (primary) hypertension: Secondary | ICD-10-CM | POA: Diagnosis not present

## 2019-10-28 DIAGNOSIS — I2510 Atherosclerotic heart disease of native coronary artery without angina pectoris: Secondary | ICD-10-CM | POA: Insufficient documentation

## 2019-10-28 DIAGNOSIS — Z955 Presence of coronary angioplasty implant and graft: Secondary | ICD-10-CM | POA: Diagnosis not present

## 2019-10-28 DIAGNOSIS — L03012 Cellulitis of left finger: Secondary | ICD-10-CM

## 2019-10-28 DIAGNOSIS — Z7982 Long term (current) use of aspirin: Secondary | ICD-10-CM | POA: Insufficient documentation

## 2019-10-28 DIAGNOSIS — Z7984 Long term (current) use of oral hypoglycemic drugs: Secondary | ICD-10-CM | POA: Insufficient documentation

## 2019-10-28 DIAGNOSIS — M7989 Other specified soft tissue disorders: Secondary | ICD-10-CM | POA: Diagnosis present

## 2019-10-28 LAB — LACTIC ACID, PLASMA: Lactic Acid, Venous: 0.9 mmol/L (ref 0.5–1.9)

## 2019-10-28 LAB — COMPREHENSIVE METABOLIC PANEL
ALT: 13 U/L (ref 0–44)
AST: 16 U/L (ref 15–41)
Albumin: 4.1 g/dL (ref 3.5–5.0)
Alkaline Phosphatase: 66 U/L (ref 38–126)
Anion gap: 9 (ref 5–15)
BUN: 12 mg/dL (ref 8–23)
CO2: 26 mmol/L (ref 22–32)
Calcium: 9.3 mg/dL (ref 8.9–10.3)
Chloride: 106 mmol/L (ref 98–111)
Creatinine, Ser: 0.75 mg/dL (ref 0.44–1.00)
GFR calc Af Amer: >60 mL/min (ref >60)
GFR calc non Af Amer: >60 mL/min (ref >60)
Glucose, Bld: 148 mg/dL — ABNORMAL HIGH (ref 70–99)
Potassium: 3.5 mmol/L (ref 3.5–5.1)
Sodium: 141 mmol/L (ref 135–145)
Total Bilirubin: 1.7 mg/dL — ABNORMAL HIGH (ref 0.3–1.2)
Total Protein: 7.4 g/dL (ref 6.5–8.1)

## 2019-10-28 LAB — CBC WITH DIFFERENTIAL/PLATELET
Abs Immature Granulocytes: 0.02 10*3/uL (ref 0.00–0.07)
Basophils Absolute: 0.1 10*3/uL (ref 0.0–0.1)
Basophils Relative: 1 %
Eosinophils Absolute: 0.1 10*3/uL (ref 0.0–0.5)
Eosinophils Relative: 1 %
HCT: 32.6 % — ABNORMAL LOW (ref 36.0–46.0)
Hemoglobin: 10.3 g/dL — ABNORMAL LOW (ref 12.0–15.0)
Immature Granulocytes: 0 %
Lymphocytes Relative: 12 %
Lymphs Abs: 1.1 10*3/uL (ref 0.7–4.0)
MCH: 27.9 pg (ref 26.0–34.0)
MCHC: 31.6 g/dL (ref 30.0–36.0)
MCV: 88.3 fL (ref 80.0–100.0)
Monocytes Absolute: 0.5 10*3/uL (ref 0.1–1.0)
Monocytes Relative: 5 %
Neutro Abs: 7.8 10*3/uL — ABNORMAL HIGH (ref 1.7–7.7)
Neutrophils Relative %: 81 %
Platelets: 387 10*3/uL (ref 150–400)
RBC: 3.69 MIL/uL — ABNORMAL LOW (ref 3.87–5.11)
RDW: 11.8 % (ref 11.5–15.5)
WBC: 9.6 10*3/uL (ref 4.0–10.5)
nRBC: 0 % (ref 0.0–0.2)

## 2019-10-28 MED ORDER — LIDOCAINE HCL (PF) 1 % IJ SOLN
5.0000 mL | Freq: Once | INTRAMUSCULAR | Status: AC
  Administered 2019-10-28: 5 mL
  Filled 2019-10-28: qty 5

## 2019-10-28 MED ORDER — SODIUM CHLORIDE 0.9% FLUSH: 3.0000 mL | Freq: Once | INTRAVENOUS | Status: DC

## 2019-10-28 NOTE — ED Provider Notes (Signed)
[facility] EMERGENCY DEPARTMENT Provider Note   CSN: [id number] Arrival date & time: 10/28/19  1344     History Chief Complaint  Patient presents with  . thumb swelling    Kim Bowers is a 79 y.o. female w past medical history of type 2 diabetes, hypertension, hyperlipidemia, CAD, presenting the emergency department with complaint of progressively worsening swelling and pain to the left thumb that began on Monday.  She states initially the swelling and pain was at her fingertip and behind her fingernail however it is progressed into the the pad of the thumb.  She went to a walk-in clinic yesterday and states they called it a paronychia and prescribed her doxycycline, told her to soak it in Epsom salt.  Symptoms have not improved.  She denies fevers or chills.  She states her diabetes is pretty well controlled with Metformin and diet.  No history of vascular insufficiency. The history is provided by the patient.       Past Medical History:  Diagnosis Date  . Diabetes mellitus   . Hypercholesteremia   . Hypertension     Patient Active Problem List   Diagnosis Date Noted  . Type II or unspecified type diabetes mellitus without mention of complication, uncontrolled 01/29/2014  . Pure hypercholesterolemia 01/29/2014  . Ischemic heart disease 01/29/2014  . Osteoarthritis of knee, unilateral 01/29/2014  . Chest pain 12/16/2013  . CAD (coronary artery disease) 12/16/2013  . HTN (hypertension) 12/16/2013    Past Surgical History:  Procedure Laterality Date  . ABDOMINAL HYSTERECTOMY    . CORONARY ANGIOPLASTY WITH STENT PLACEMENT       OB History   No obstetric history on file.     No family history on file.  Social History   Tobacco Use  . Smoking status: Never Smoker  Substance Use Topics  . Alcohol use: No  . Drug use: No    Home Medications Prior to Admission medications   Medication Sig Start Date End Date Taking? Authorizing Provider    acetaminophen (TYLENOL) 325 MG tablet Take 2 tablets (650 mg total) by mouth every 6 (six) hours as needed for mild pain (or Fever >/= 101). 12/17/13   Buriev, Isaiah Serge, MD  amLODipine-benazepril (LOTREL) 10-20 MG per capsule Take 1 capsule by mouth daily. 12/17/13   Esperanza Sheets, MD  aspirin EC 81 MG tablet Take 1 tablet (81 mg total) by mouth daily. 12/17/13   Esperanza Sheets, MD  metFORMIN (GLUCOPHAGE-XR) 500 MG 24 hr tablet Take 500 mg by mouth daily with breakfast. 12/17/13   Esperanza Sheets, MD  metoprolol succinate (TOPROL-XL) 50 MG 24 hr tablet Take 1 tablet (50 mg total) by mouth daily. Take with or immediately following a meal. 12/17/13   Buriev, Isaiah Serge, MD  simvastatin (ZOCOR) 20 MG tablet Take 1 tablet (20 mg total) by mouth daily. 12/17/13   Esperanza Sheets, MD    Allergies    Patient has no known allergies.  Review of Systems   Review of Systems  Constitutional: Negative for chills and fever.  Skin: Positive for color change.    Physical Exam Updated Vital Signs BP 136/81 (BP Location: Right Arm)   Pulse 72   Temp 98.6 F (37 C) (Oral)   Resp 18   SpO2 98%   Physical Exam Vitals and nursing note reviewed.  Constitutional:      General: She is not in acute distress.    Appearance: She is well-developed.  HENT:     Head: Normocephalic and atraumatic.  Eyes:     Conjunctiva/sclera: Conjunctivae normal.  Cardiovascular:     Rate and Rhythm: Normal rate.  Pulmonary:     Effort: Pulmonary effort is normal.  Skin:    Comments: Left distal portion of the distal phalanx with fluctuance, obvious pus beneath the skin, and ecchymosis. this is circumferential about the pad of the digit. There is also pus underneath the distal portion of the fingernail.  Swelling extends through the thumb.  No streaking or erythema extending into the hand or arm.  No herpetic lesions or wounds. See images below.  Neurological:     Mental Status: She is alert.  Psychiatric:        Mood  and Affect: Mood normal.        Behavior: Behavior normal.             ED Results / Procedures / Treatments   Labs (all labs ordered are listed, but only abnormal results are displayed) Labs Reviewed  COMPREHENSIVE METABOLIC PANEL - Abnormal; Notable for the following components:      Result Value   Glucose, Bld 148 (*)    Total Bilirubin 1.7 (*)    All other components within normal limits  CBC WITH DIFFERENTIAL/PLATELET - Abnormal; Notable for the following components:   RBC 3.69 (*)    Hemoglobin 10.3 (*)    HCT 32.6 (*)    Neutro Abs 7.8 (*)    All other components within normal limits  LACTIC ACID, PLASMA    EKG None  Radiology No results found.  Procedures .Marland KitchenIncision and Drainage  Date/Time: 10/28/2019 4:48 PM Performed by: Abuk Selleck, [place] N, PA-C Authorized by: Riki Berninger, [place] N, PA-C   Consent:    Consent obtained:  Verbal   Consent given by:  Patient   Risks discussed:  Bleeding, incomplete drainage and pain   Alternatives discussed:  Alternative treatment Location:    Type:  Abscess (felon)   Location:  Upper extremity   Upper extremity location:  Finger   Finger location:  L thumb Pre-procedure details:    Skin preparation:  Chloraprep Anesthesia (see MAR for exact dosages):    Anesthesia method:  Nerve block   Block needle gauge:  25 G   Block anesthetic:  Lidocaine 1% w/o epi   Block injection procedure:  Anatomic landmarks palpated   Block outcome:  Incomplete block Procedure type:    Complexity:  Simple Procedure details:    Needle aspiration: no     Incision types:  Single straight   Incision depth:  Dermal   Scalpel blade:  11   Wound management:  Probed and deloculated and irrigated with saline   Drainage:  Bloody and purulent   Drainage amount:  Copious   Wound treatment:  Wound left open   Packing materials:  None Post-procedure details:    Patient tolerance of procedure:  Tolerated well, no immediate complications    (including critical care time)  Medications Ordered in ED Medications  sodium chloride flush (NS) 0.9 % injection 3 mL (3 mLs Intravenous Not Given 10/28/19 1500)  lidocaine (PF) (XYLOCAINE) 1 % injection 5 mL (5 mLs Infiltration Given 10/28/19 1537)    ED Course  I have reviewed the triage vital signs and the nursing notes.  Pertinent labs & imaging results that were available during my care of the patient were reviewed by me and considered in my medical decision making (see chart for details).  MDM Rules/Calculators/A&P                          Patient presenting with left foot pain and swelling since Monday.  She was given doxycycline walk-in clinic yesterday.  On exam she has obvious felon.  No extending erythema or streaking down the hand or arm.  No herpetic lesions. No fevers or systemic symptoms.  She does have diabetes though appears to be well controlled.  Felon was drained at bedside and left open.  Copious amount of purulent drainage was expressed.  Recommend patient continue the doxycycline as prescribed twice daily until gone.  She is instructed of close follow-up within 2 to 3 days for recheck.  Strict return precautions.  Patient is well-appearing, agreeable to plan, appropriate for discharge.  Hand specialist referral provided as needed for any complications.  Discussed results, findings, treatment and follow up. Patient advised of return precautions. Patient verbalized understanding and agreed with plan.  Final Clinical Impression(s) / ED Diagnoses Final diagnoses:  Felon of finger of left hand    Rx / DC Orders ED Discharge Orders    None       Kairo Laubacher, [place] N, PA-C 10/28/19 1659    Quintella Reichert, MD 10/29/19 0002

## 2019-10-28 NOTE — ED Notes (Signed)
Pt verbalized understanding of discharge instructions. Follow up care, pain management, and wound care reviewed. Pt had no further questions, ambulated independently to lobby.

## 2019-10-28 NOTE — ED Triage Notes (Signed)
C/o pain, swelling, and discoloration to L thumb since Monday.

## 2019-10-28 NOTE — Discharge Instructions (Addendum)
Please read instructions below.  Keep your wound clean and covered. Soak/flush your wound with warm water, multiple times per day. You can take over-the-counter medications for pain. Continue the antibiotic, doxycycline, as directed until gone.  Follow up with your primary care or urgent care for wound recheck in 2 days.  Return to the ER for fever, worsening redness, or new or worsening symptoms.

## 2020-12-27 ENCOUNTER — Emergency Department (HOSPITAL_COMMUNITY): Payer: Medicare Other

## 2020-12-27 ENCOUNTER — Encounter (HOSPITAL_COMMUNITY): Payer: Self-pay | Admitting: Emergency Medicine

## 2020-12-27 ENCOUNTER — Emergency Department (HOSPITAL_COMMUNITY)
Admission: EM | Admit: 2020-12-27 | Discharge: 2020-12-27 | Disposition: A | Discharge status: Home / Self Care | Payer: Medicare Other | Attending: Emergency Medicine | Admitting: Emergency Medicine

## 2020-12-27 DIAGNOSIS — W172XXA Fall into hole, initial encounter: Secondary | ICD-10-CM | POA: Insufficient documentation

## 2020-12-27 DIAGNOSIS — S8001XA Contusion of right knee, initial encounter: Secondary | ICD-10-CM | POA: Diagnosis not present

## 2020-12-27 DIAGNOSIS — S8002XA Contusion of left knee, initial encounter: Secondary | ICD-10-CM | POA: Insufficient documentation

## 2020-12-27 DIAGNOSIS — Z7982 Long term (current) use of aspirin: Secondary | ICD-10-CM | POA: Diagnosis not present

## 2020-12-27 DIAGNOSIS — E119 Type 2 diabetes mellitus without complications: Secondary | ICD-10-CM | POA: Diagnosis not present

## 2020-12-27 DIAGNOSIS — S46911A Strain of unspecified muscle, fascia and tendon at shoulder and upper arm level, right arm, initial encounter: Secondary | ICD-10-CM | POA: Insufficient documentation

## 2020-12-27 DIAGNOSIS — I2510 Atherosclerotic heart disease of native coronary artery without angina pectoris: Secondary | ICD-10-CM | POA: Insufficient documentation

## 2020-12-27 DIAGNOSIS — Z79899 Other long term (current) drug therapy: Secondary | ICD-10-CM | POA: Insufficient documentation

## 2020-12-27 DIAGNOSIS — S8000XA Contusion of unspecified knee, initial encounter: Secondary | ICD-10-CM

## 2020-12-27 DIAGNOSIS — Y9301 Activity, walking, marching and hiking: Secondary | ICD-10-CM | POA: Diagnosis not present

## 2020-12-27 DIAGNOSIS — Z7984 Long term (current) use of oral hypoglycemic drugs: Secondary | ICD-10-CM | POA: Diagnosis not present

## 2020-12-27 DIAGNOSIS — S4991XA Unspecified injury of right shoulder and upper arm, initial encounter: Secondary | ICD-10-CM | POA: Diagnosis present

## 2020-12-27 DIAGNOSIS — I10 Essential (primary) hypertension: Secondary | ICD-10-CM | POA: Insufficient documentation

## 2020-12-27 DIAGNOSIS — W19XXXA Unspecified fall, initial encounter: Secondary | ICD-10-CM

## 2020-12-27 HISTORY — DX: Unspecified osteoarthritis, unspecified site: M19.90

## 2020-12-27 NOTE — ED Provider Notes (Signed)
[facility] EMERGENCY DEPARTMENT Provider Note   CSN: [id number] Arrival date & time: 12/27/20  3704     History Chief Complaint  Patient presents with   [name]    Kim Bowers is a 80 y.o. female.  HPI Patient reports that she was outside walking yesterday.  She walked up onto a grassy area and did not notice a hole that she ended up tripping on.  She reports she did not strike her head or get knocked out.  She has had some pain now in the right shoulder.  Also some pain in the knees.  She has however been up and active without limitation.    Past Medical History:  Diagnosis Date   Arthritis    Diabetes mellitus    Hypercholesteremia    Hypertension     Patient Active Problem List   Diagnosis Date Noted   Type II or unspecified type diabetes mellitus without mention of complication, uncontrolled 01/29/2014   Pure hypercholesterolemia 01/29/2014   Ischemic heart disease 01/29/2014   Osteoarthritis of knee, unilateral 01/29/2014   Chest pain 12/16/2013   CAD (coronary artery disease) 12/16/2013   HTN (hypertension) 12/16/2013    Past Surgical History:  Procedure Laterality Date   ABDOMINAL HYSTERECTOMY     CORONARY ANGIOPLASTY WITH STENT PLACEMENT       OB History   No obstetric history on file.     No family history on file.  Social History   Tobacco Use   Smoking status: Never  Substance Use Topics   Alcohol use: No   Drug use: No    Home Medications Prior to Admission medications   Medication Sig Start Date End Date Taking? Authorizing Provider  acetaminophen (TYLENOL) 325 MG tablet Take 2 tablets (650 mg total) by mouth every 6 (six) hours as needed for mild pain (or Fever >/= 101). 12/17/13   Buriev, Isaiah Serge, MD  amLODipine-benazepril (LOTREL) 10-20 MG per capsule Take 1 capsule by mouth daily. 12/17/13   Esperanza Sheets, MD  aspirin EC 81 MG tablet Take 1 tablet (81 mg total) by mouth daily. 12/17/13   Esperanza Sheets, MD   metFORMIN (GLUCOPHAGE-XR) 500 MG 24 hr tablet Take 500 mg by mouth daily with breakfast. 12/17/13   Esperanza Sheets, MD  metoprolol succinate (TOPROL-XL) 50 MG 24 hr tablet Take 1 tablet (50 mg total) by mouth daily. Take with or immediately following a meal. 12/17/13   Buriev, Isaiah Serge, MD  simvastatin (ZOCOR) 20 MG tablet Take 1 tablet (20 mg total) by mouth daily. 12/17/13   Esperanza Sheets, MD    Allergies    Patient has no known allergies.  Review of Systems   Review of Systems Neurologic: No headache no confusion Respiratory: No shortness of breath no cough GI: No abdominal pain no nausea no vomiting Physical Exam Updated Vital Signs BP (!) 161/97   Pulse (!) 58   Temp 98.9 F (37.2 C)   Resp 14   SpO2 99%   Physical Exam Constitutional:      Comments: Patient is alert and well in appearance.  No distress.  Drinking coffee  HENT:     Head: Normocephalic and atraumatic.     Mouth/Throat:     Pharynx: Oropharynx is clear.  Eyes:     Extraocular Movements: Extraocular movements intact.  Cardiovascular:     Rate and Rhythm: Normal rate and regular rhythm.  Pulmonary:     Effort: Pulmonary effort is  normal.     Breath sounds: Normal breath sounds.  Chest:     Chest wall: No tenderness.  Abdominal:     General: There is no distension.     Palpations: Abdomen is soft.     Tenderness: There is no abdominal tenderness. There is no guarding.  Musculoskeletal:        General: No swelling or deformity. Normal range of motion.     Right lower leg: No edema.     Left lower leg: No edema.     Comments: No visible soft tissue injuries to the shoulder or upper extremities.  Range of motion is intact to the shoulder.  Patient does have discomfort with abduction greater than 90 degrees of the right shoulder.  Patient is neurovascularly intact.  No deformities or effusions at the knees.  Normal range of motion.  Skin:    General: Skin is warm and dry.  Neurological:     General:  No focal deficit present.     Mental Status: She is oriented to person, place, and time.     Coordination: Coordination normal.  Psychiatric:        Mood and Affect: Mood normal.    ED Results / Procedures / Treatments   Labs (all labs ordered are listed, but only abnormal results are displayed) Labs Reviewed - No data to display  EKG None  Radiology DG Chest 2 View  Result Date: 12/27/2020 CLINICAL DATA:  FELL DOWN YESTERDAY ON THE GRASS,,BILAT ANT KNEE PAIN RT >LT TOP OF RT SHOULDER PAIN ,NO CHEST COMPfall EXAM: CHEST - 2 VIEW COMPARISON:  None. FINDINGS: Normal mediastinum and cardiac silhouette. Normal pulmonary vasculature. No evidence of effusion, infiltrate, or pneumothorax. No acute bony abnormality. IMPRESSION: No evidence of thoracic trauma. Electronically Signed   By: Genevive Bi M.D.   On: 12/27/2020 10:12   DG Shoulder Right  Result Date: 12/27/2020 CLINICAL DATA:  Fall yesterday with shoulder pain EXAM: RIGHT SHOULDER - 2+ VIEW COMPARISON:  None. FINDINGS: There is no evidence of fracture or dislocation. Lateral acromion spurring. No glenohumeral joint narrowing. No acute soft tissue finding. IMPRESSION: Negative for fracture or dislocation. Electronically Signed   By: Marnee Spring M.D.   On: 12/27/2020 10:05   DG Knee Complete 4 Views Left  Result Date: 12/27/2020 CLINICAL DATA:  Left knee pain after fall yesterday. EXAM: LEFT KNEE - COMPLETE 4+ VIEW COMPARISON:  None. FINDINGS: No evidence of fracture, dislocation, or joint effusion. No evidence of arthropathy or other focal bone abnormality. Soft tissues are unremarkable. IMPRESSION: Negative. Electronically Signed   By: Obie Dredge M.D.   On: 12/27/2020 10:09   DG Knee Complete 4 Views Right  Result Date: 12/27/2020 CLINICAL DATA:  Fall on grass yesterday bilateral anterior knee pain. EXAM: RIGHT KNEE - COMPLETE 4+ VIEW COMPARISON:  Contralateral knee of the same date. FINDINGS: Osteopenia. Degenerative  changes about the knee. No acute fracture or dislocation. Degenerative changes greatest in the medial and patellofemoral compartments. IMPRESSION: Degenerative changes. No acute finding. Electronically Signed   By: Donzetta Kohut M.D.   On: 12/27/2020 10:10    Procedures Procedures   Medications Ordered in ED Medications - No data to display  ED Course  I have reviewed the triage vital signs and the nursing notes.  Pertinent labs & imaging results that were available during my care of the patient were reviewed by me and considered in my medical decision making (see chart for details).    MDM  Rules/Calculators/A&P                           Patient had mechanical fall yesterday.  She is ambulatory and doing all activities without limitation.  Physical exam does not show deformities or effusions.  No visible soft tissue abnormalities.  X-rays negative.  At this time patient has full intact baseline function.  Recommendations are for extra strength Tylenol for pain.  Counseling for warm and cold topical treatment.  Patient encouraged to follow-up in approximate 5 to 7 days and advised sometimes muscular strain and stiffness is worse 3 to 5 days after the fall.  Return precautions reviewed. Final Clinical Impression(s) / ED Diagnoses Final diagnoses:  Fall, initial encounter  Strain of right shoulder, initial encounter  Contusion of knee, unspecified laterality, initial encounter    Rx / DC Orders ED Discharge Orders     None        Arby Barrette, MD 12/27/20 1048

## 2020-12-27 NOTE — ED Notes (Signed)
Pt transported to Xray, Xray will take pt to [facility]

## 2020-12-27 NOTE — ED Triage Notes (Signed)
Patient complains of right shoulder, rib, and bilateral knee pain after tripping over a whole in the ground yesterday at approximately 1500. Patient states she did not see the whole, stepped into it and fell the ground. Did not hit head, did not lose consciousness. Patient is alert, oriented, and ambulatory at this time.

## 2020-12-27 NOTE — Discharge Instructions (Addendum)
1.  Use extra strength Tylenol every 6 hours as needed for pain.  You may also apply well wrapped ice packs to your shoulder and knees for the next 2 days.  May apply for about 20 minutes every couple of hours.  After that, warm moist heat may feel good on your neck and shoulders. 2.  Schedule a follow-up check with your doctor in 5 to 7 days to make sure your symptoms are improving.  Often after a fall or injury, pain is worse from 3 to 5 days due to increased inflammation and swelling.  Continue to treat with Tylenol and cold packs if they improve the pain.  Also on neck and shoulder strains warm moist heat is good. 3.  Return to the emergency department if you have new worsening or concerning symptoms

## 2020-12-27 NOTE — ED Notes (Signed)
Pt verbalized understanding of d/c instructions, meds and followup care. Denies questions. VSS, no distress noted. Steady gait to exit with all belongings.  ?

## 2021-12-18 ENCOUNTER — Other Ambulatory Visit: Payer: Self-pay

## 2022-07-15 IMAGING — DX DG KNEE COMPLETE 4+V*L*
4 series · 4 of 4 positions shown · non-contrast
Comparison: None.

CLINICAL DATA: Left knee pain after fall yesterday.

EXAM:
LEFT KNEE - COMPLETE 4+ VIEW

[t knee ap left]
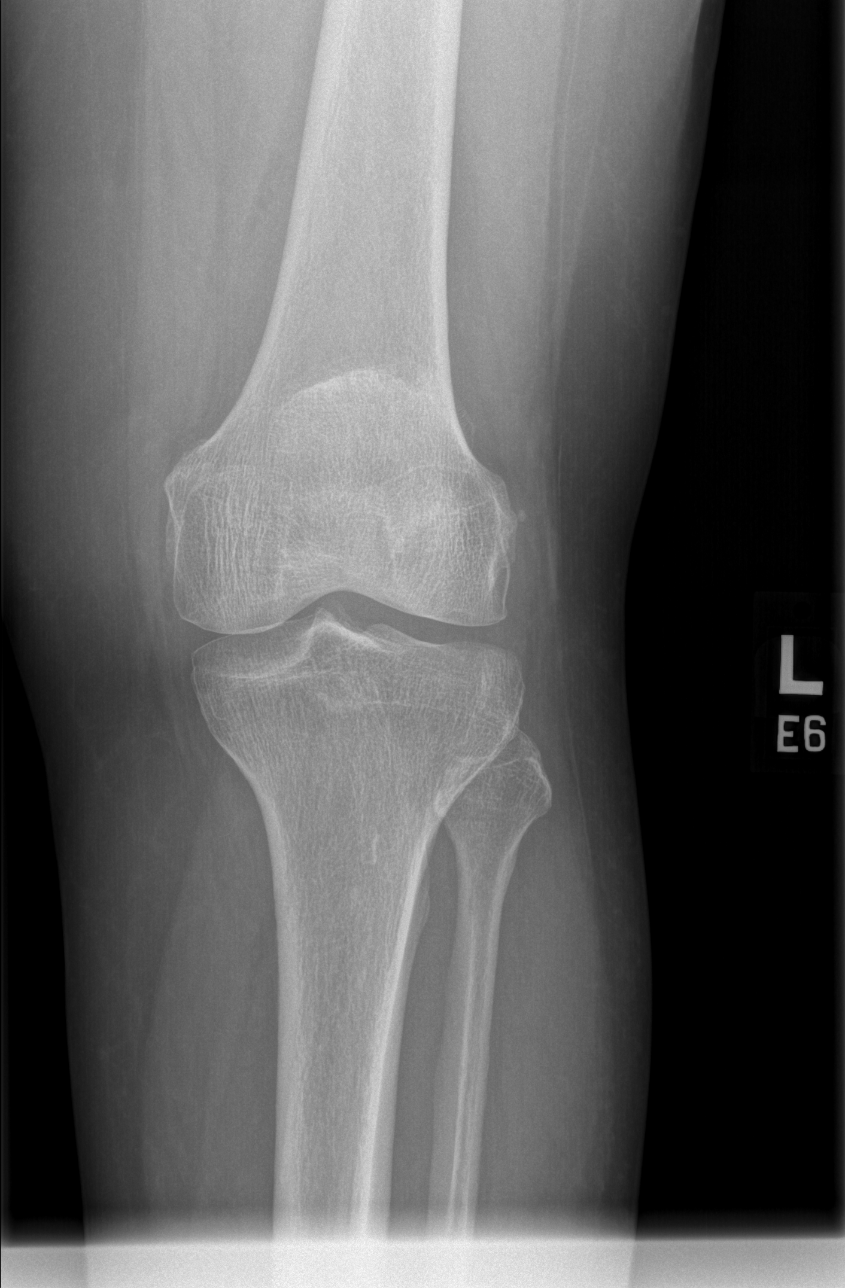

[t knee obl left (1 of 2)]
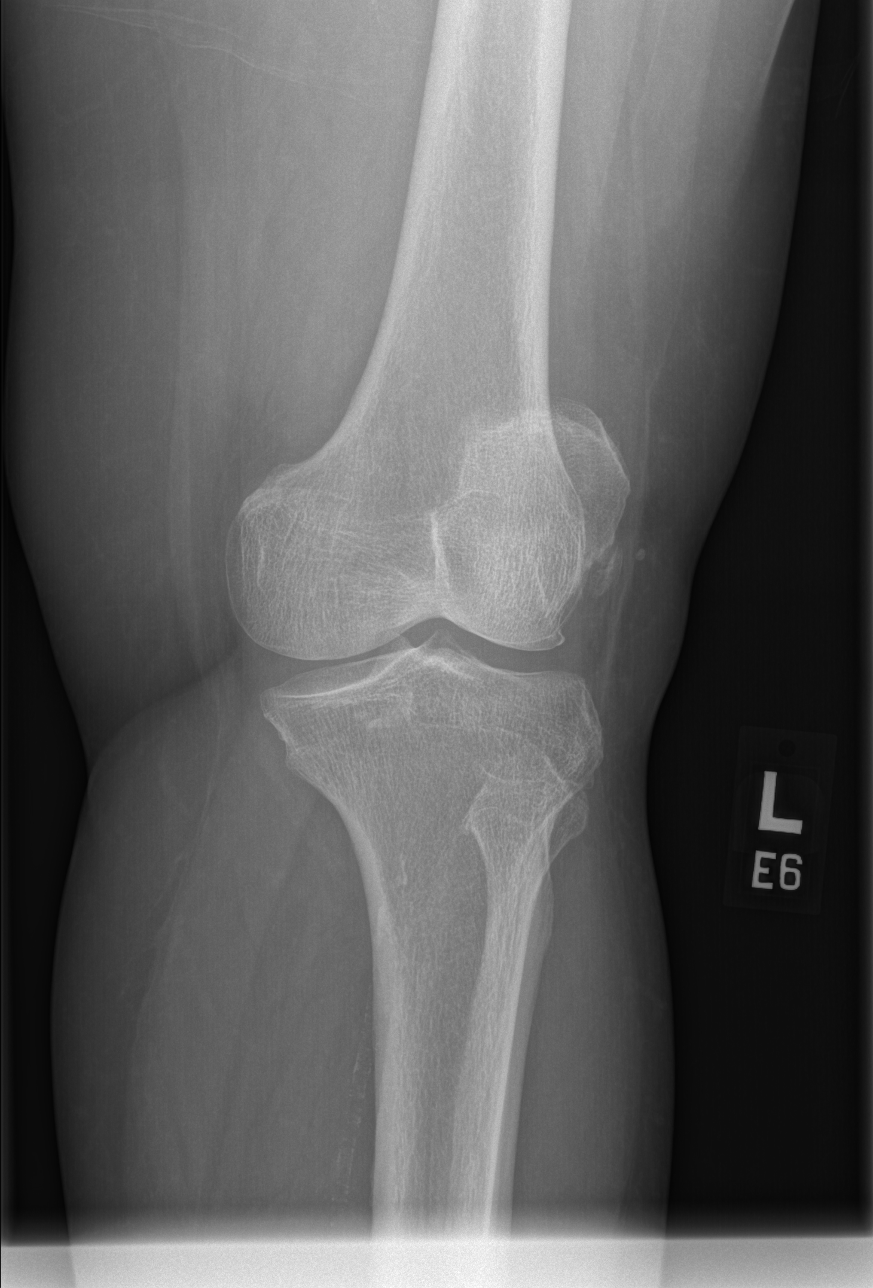

[t knee obl left (2 of 2)]
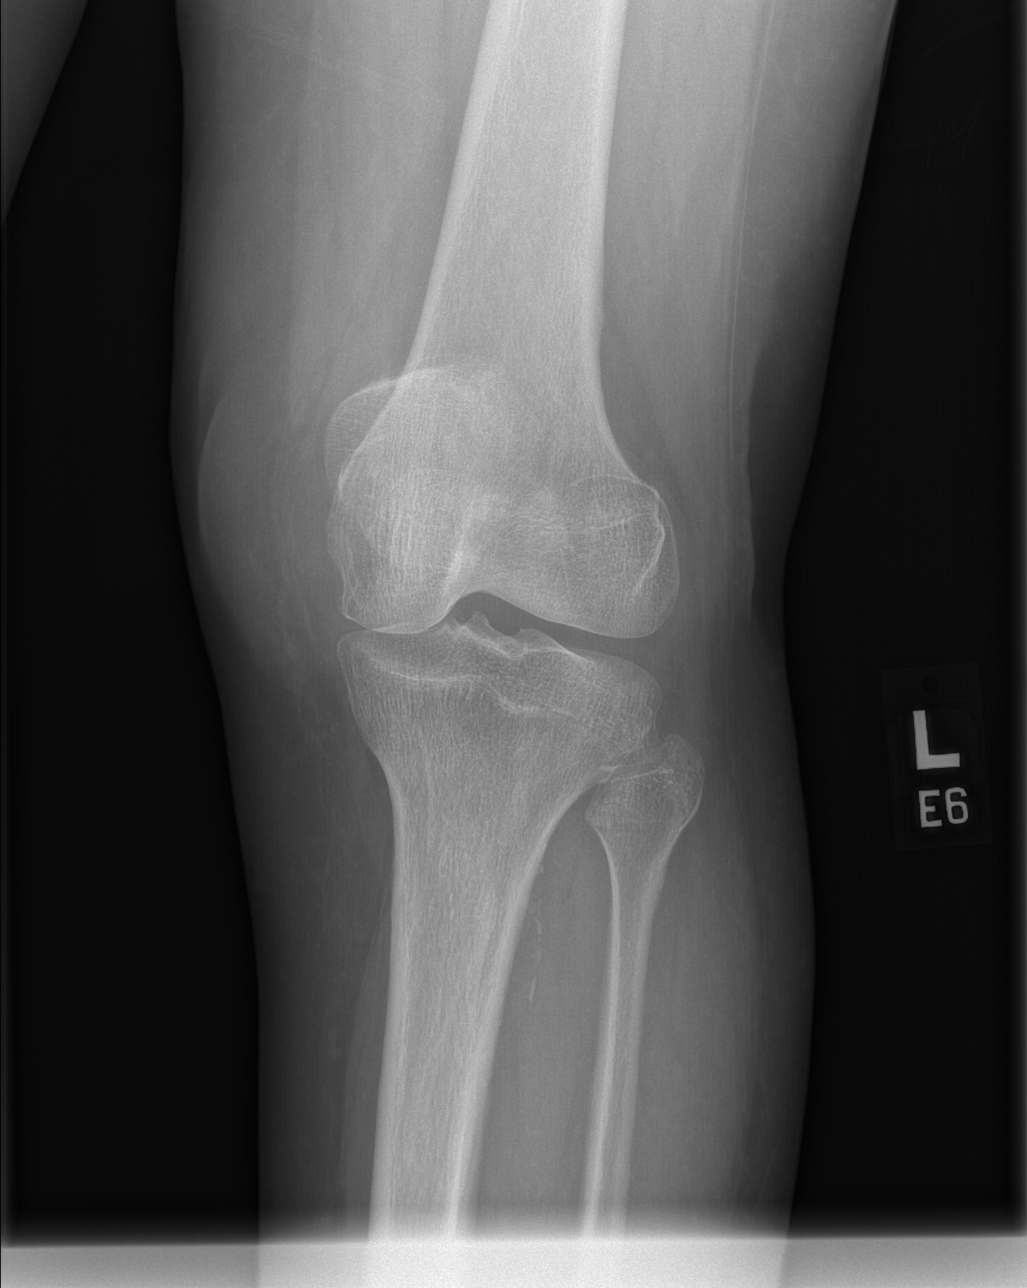

[t knee lat left]
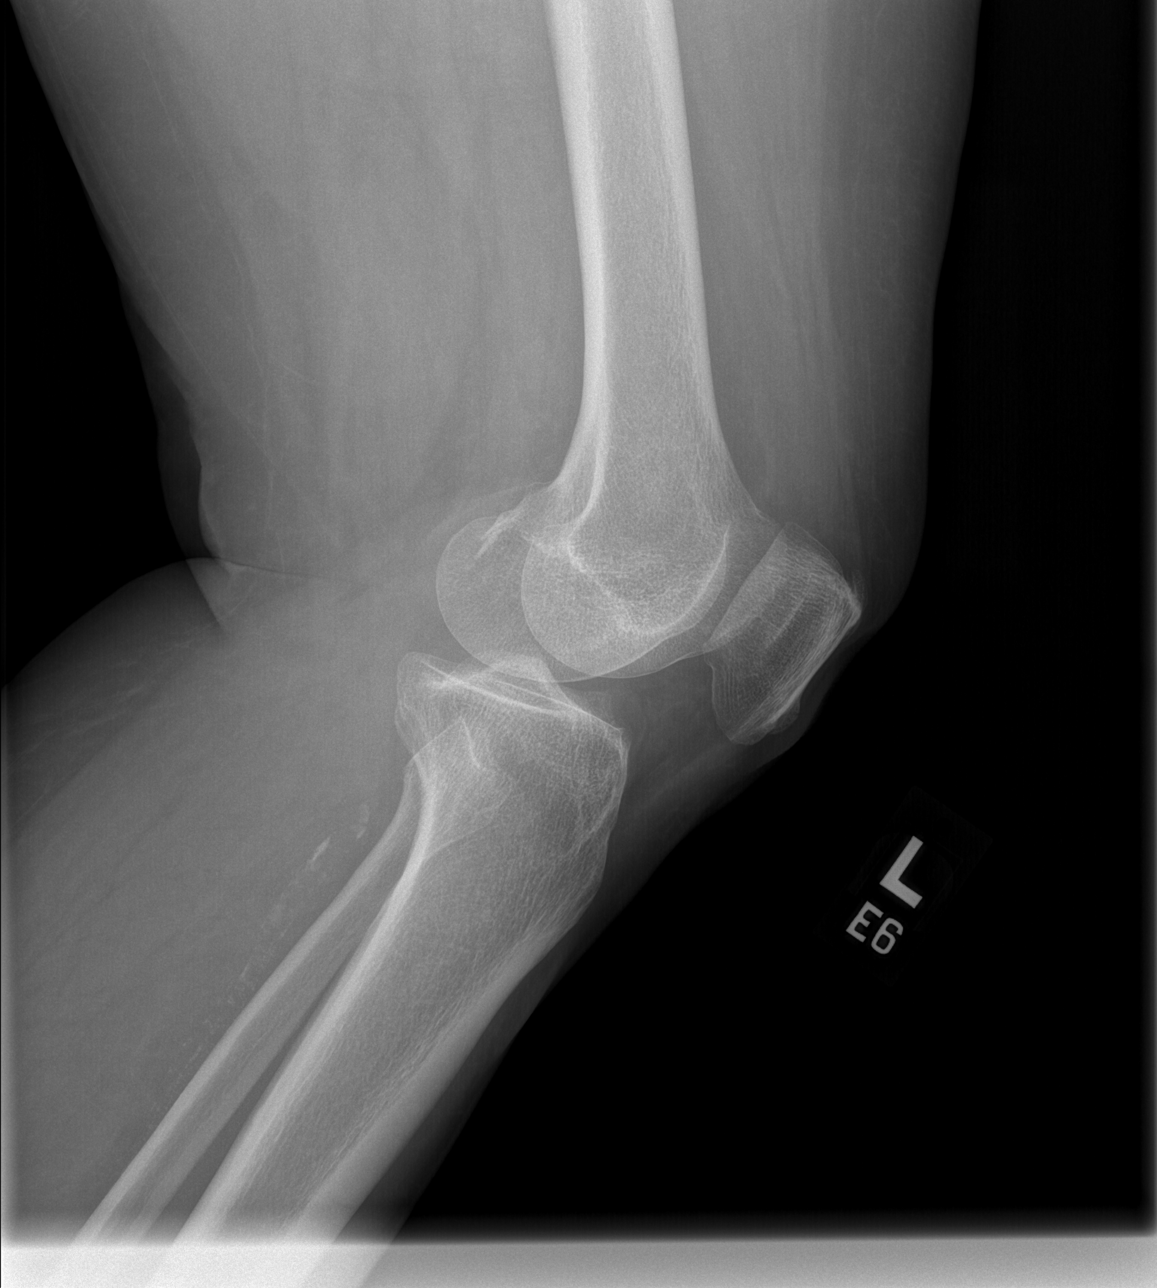

[4 of 4 positions shown; findings below may reference images not displayed]

FINDINGS: No evidence of fracture, dislocation, or joint effusion. No evidence
of arthropathy or other focal bone abnormality. Soft tissues are
unremarkable.
IMPRESSION: Negative.

## 2022-07-15 IMAGING — DX DG CHEST 2V
2 series · 2 of 2 positions shown · non-contrast
Comparison: None.

CLINICAL DATA: FELL DOWN YESTERDAY ON THE GRASS,,BILAT ANT KNEE
PAIN RT >LT TOP OF RT SHOULDER PAIN ,NO CHEST COMPfall

EXAM:
CHEST - 2 VIEW

[w chest pa]
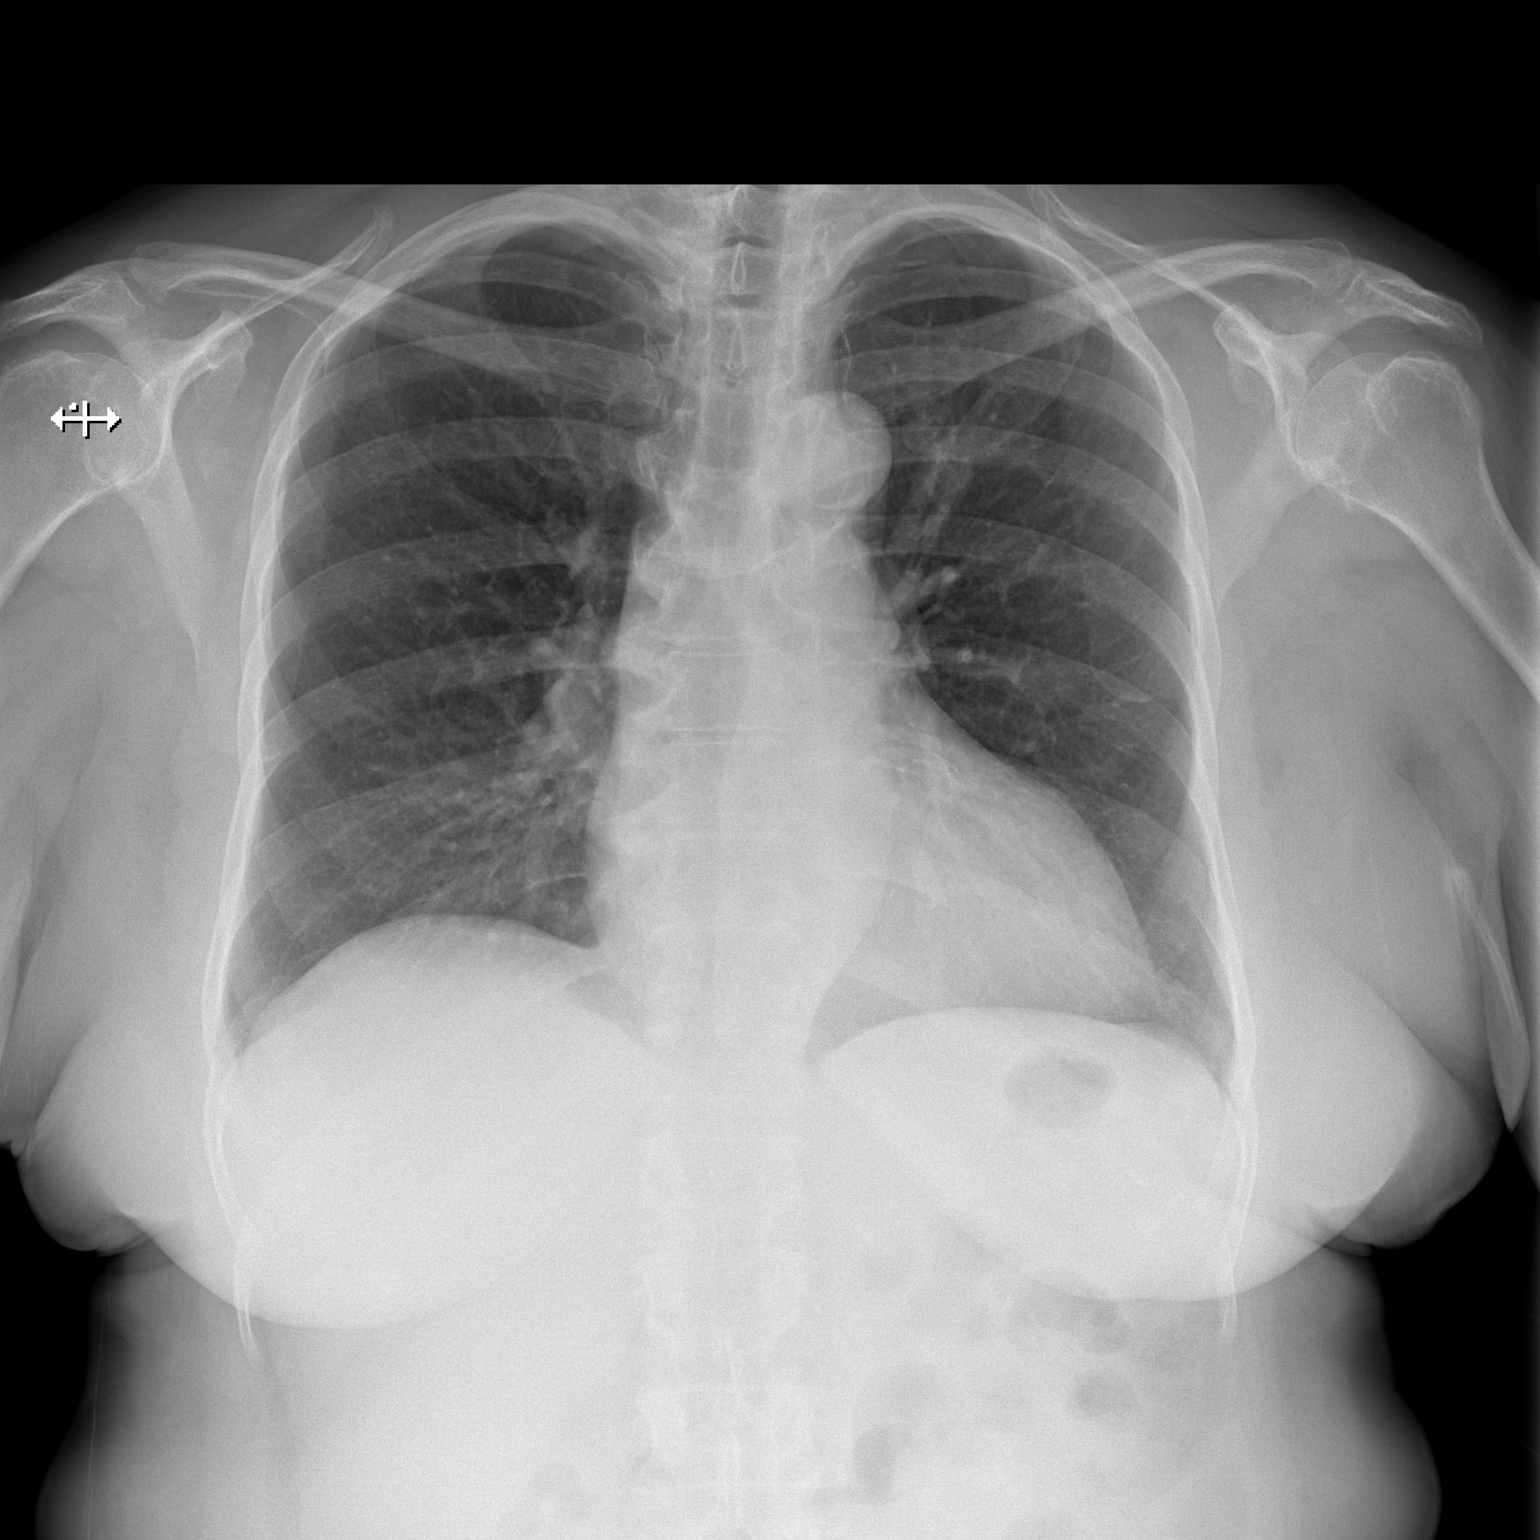

[w chest lat]
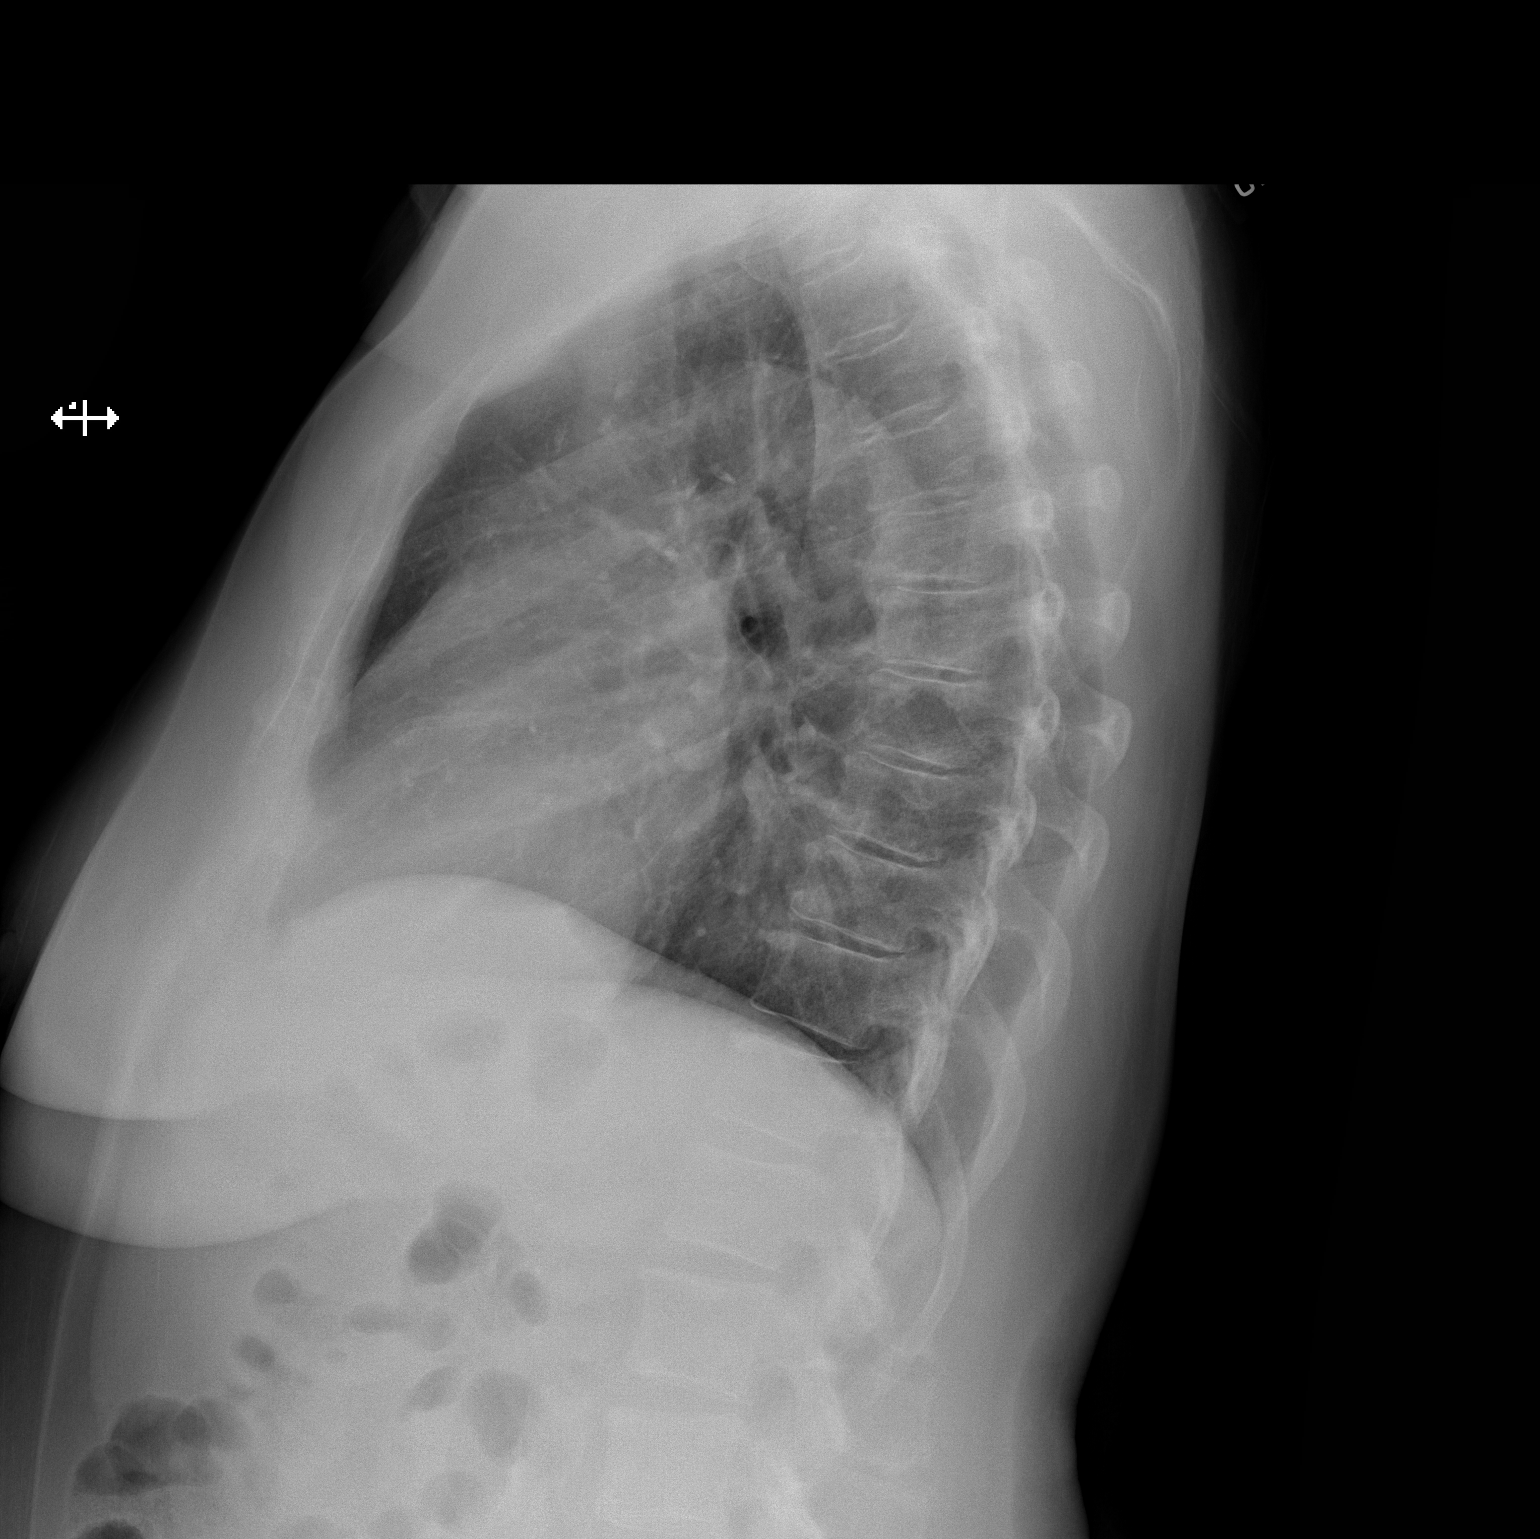

[2 of 2 positions shown; findings below may reference images not displayed]

FINDINGS: Normal mediastinum and cardiac silhouette. Normal pulmonary
vasculature. No evidence of effusion, infiltrate, or pneumothorax.
No acute bony abnormality.
IMPRESSION: No evidence of thoracic trauma.

## 2022-07-15 IMAGING — DX DG KNEE COMPLETE 4+V*R*
4 series · 4 of 4 positions shown · non-contrast
Comparison: Contralateral knee of the same date.

CLINICAL DATA: Fall on grass yesterday bilateral anterior knee
pain.

EXAM:
RIGHT KNEE - COMPLETE 4+ VIEW

[t knee ap right]
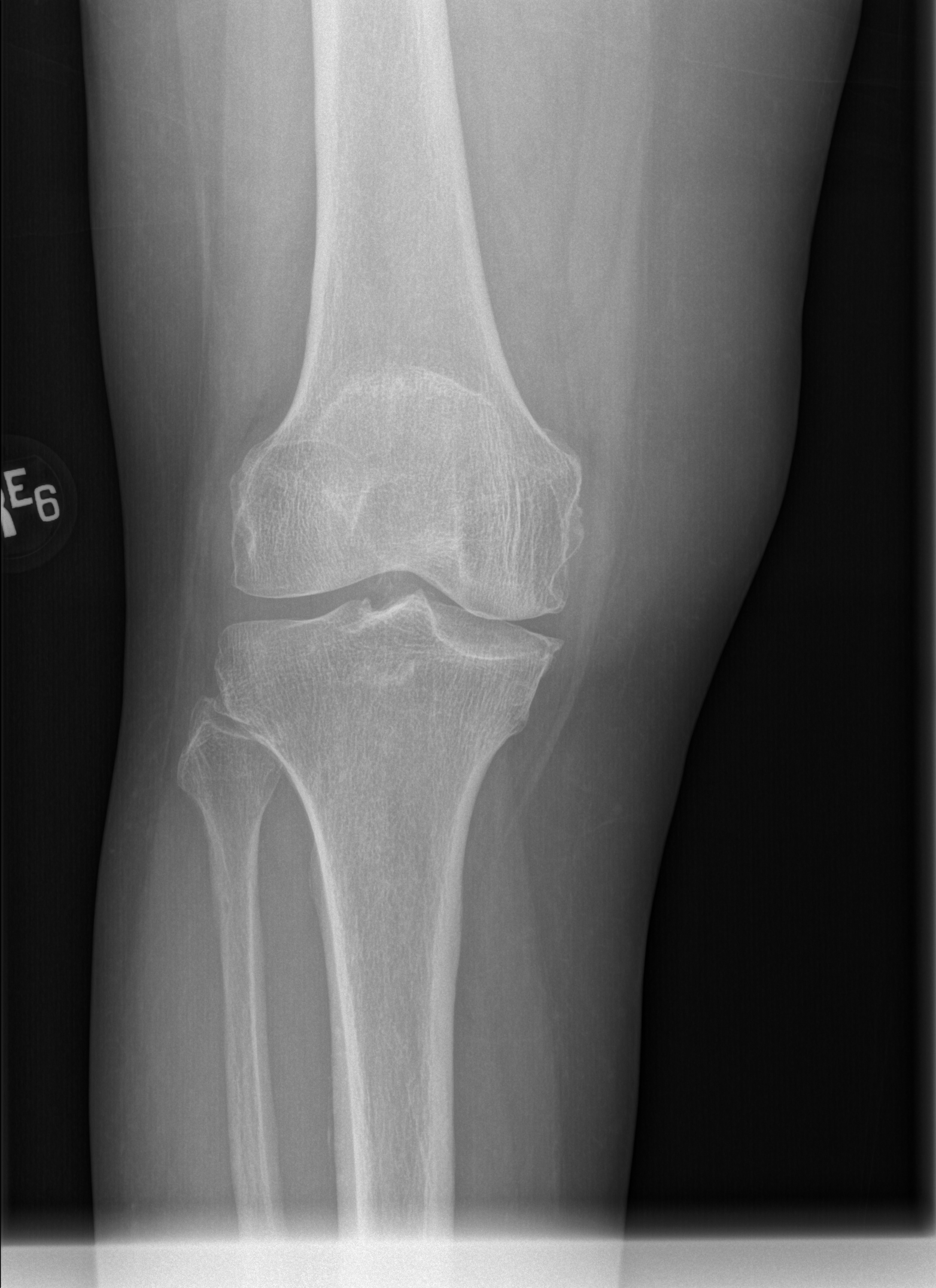

[t knee obl right (1 of 2)]
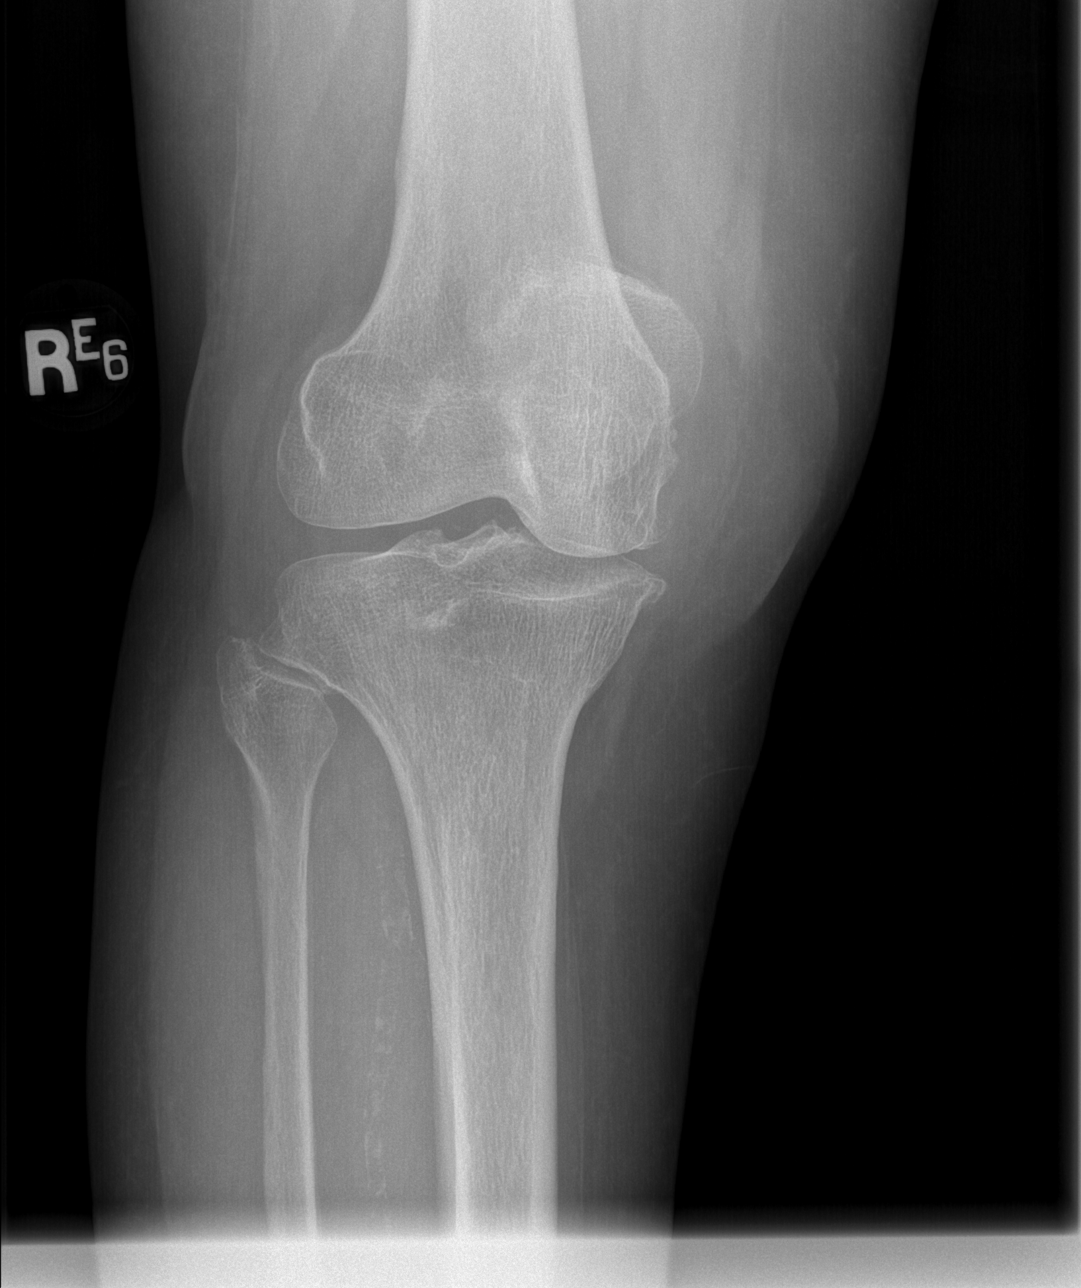

[t knee obl right (2 of 2)]
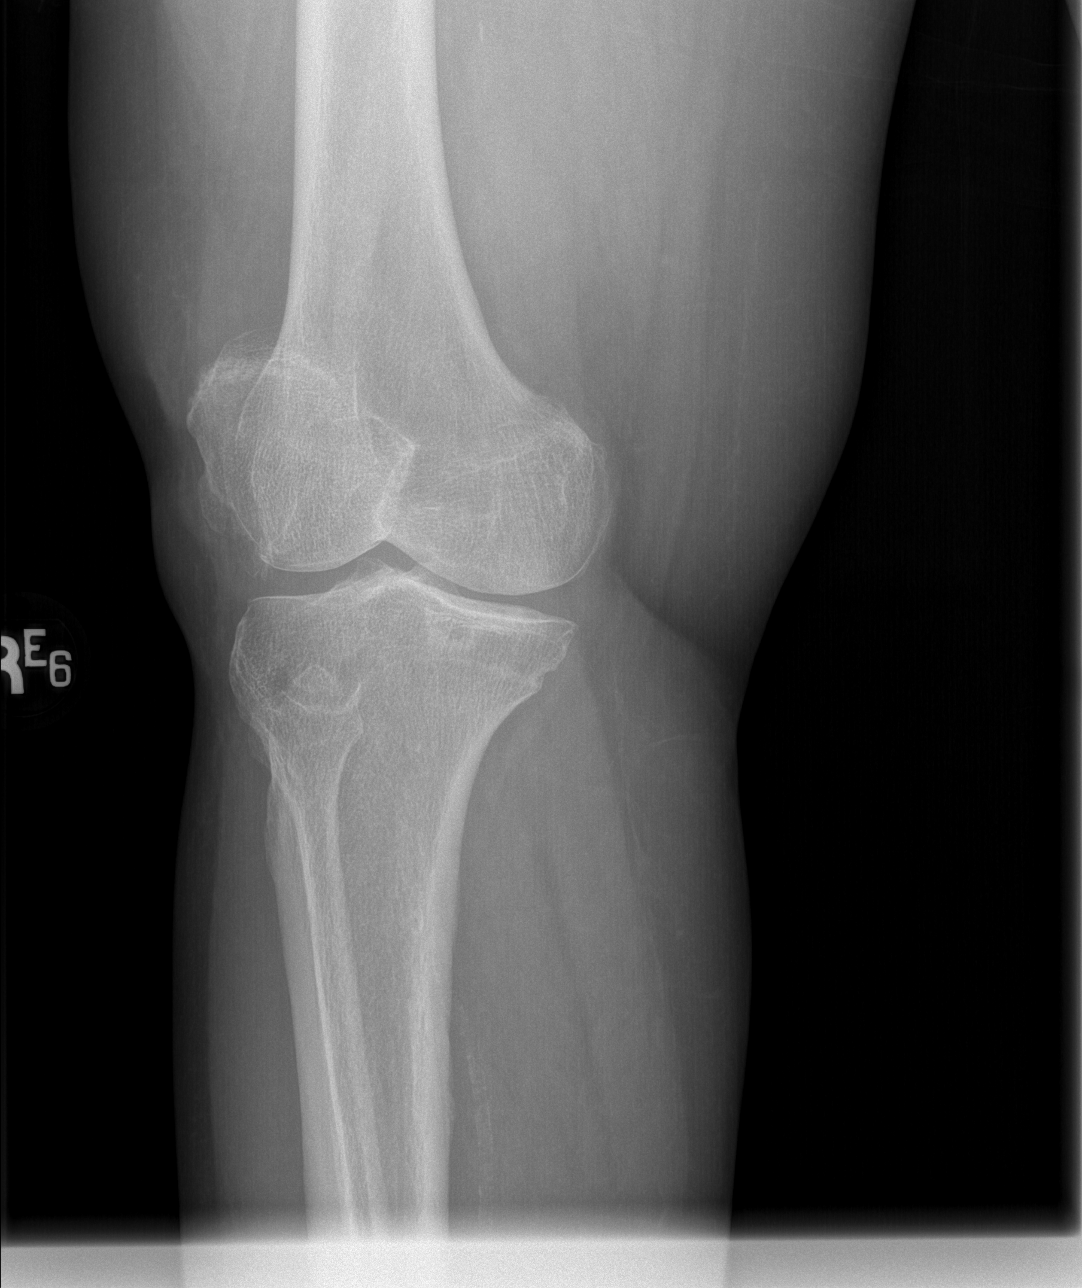

[t knee lat right]
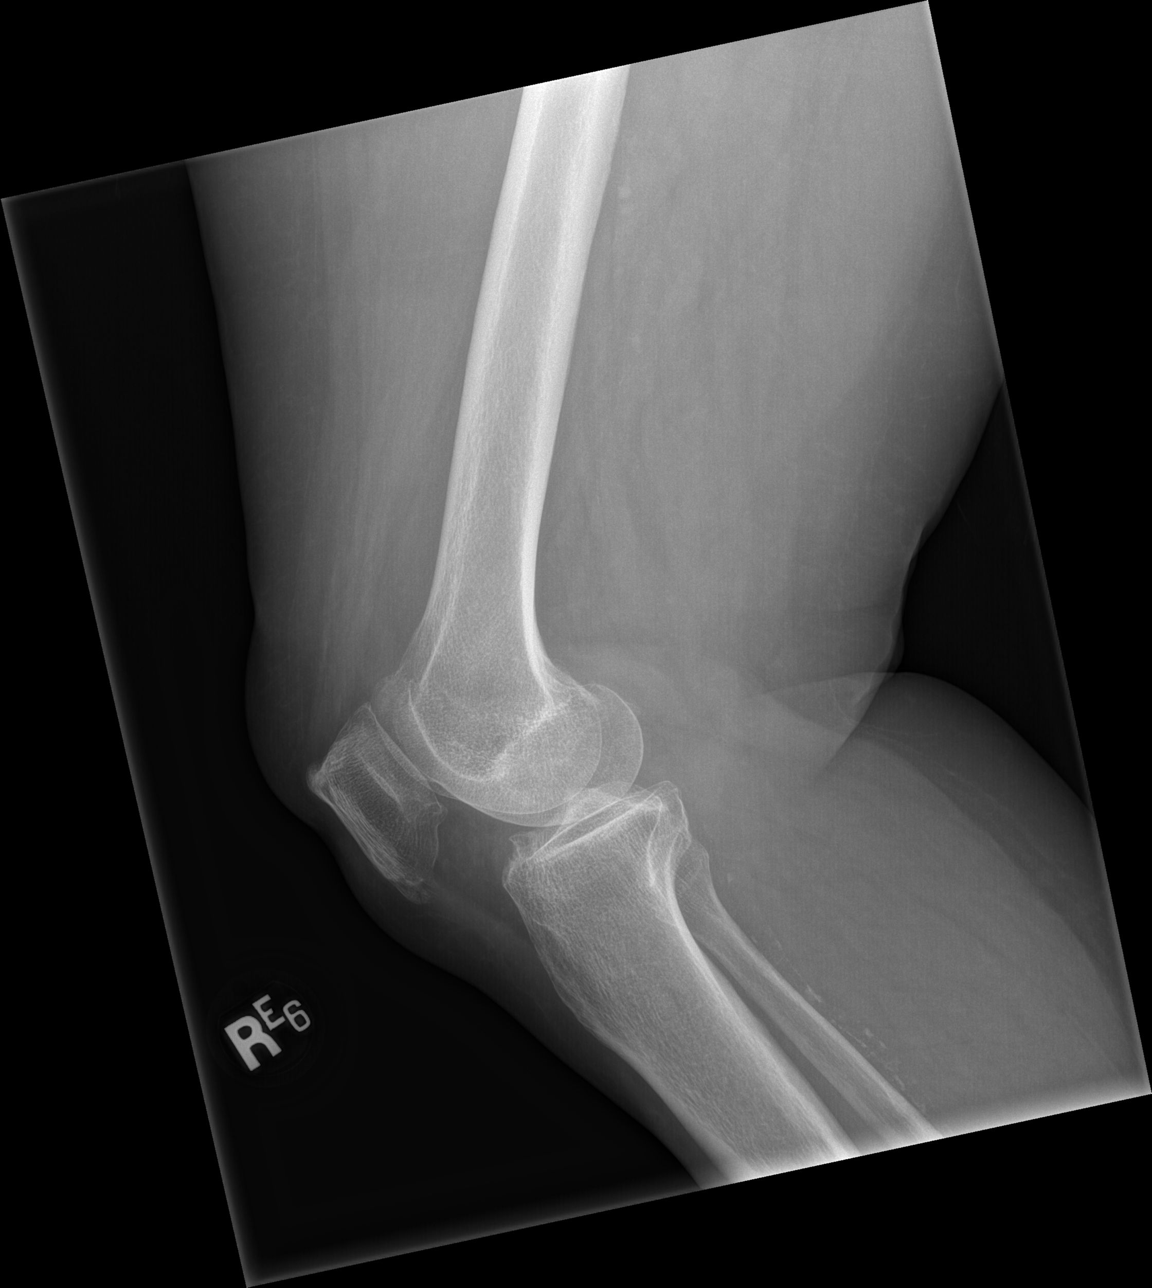

[4 of 4 positions shown; findings below may reference images not displayed]

FINDINGS: Osteopenia. Degenerative changes about the knee. No acute fracture
or dislocation. Degenerative changes greatest in the medial and
patellofemoral compartments.
IMPRESSION: Degenerative changes. No acute finding.

## 2023-09-15 ENCOUNTER — Emergency Department (HOSPITAL_COMMUNITY)
Admission: EM | Admit: 2023-09-15 | Discharge: 2023-09-16 | Disposition: A | Discharge status: Home / Self Care | Attending: Emergency Medicine | Admitting: Emergency Medicine

## 2023-09-15 ENCOUNTER — Encounter (HOSPITAL_COMMUNITY): Payer: Self-pay | Admitting: Emergency Medicine

## 2023-09-15 ENCOUNTER — Emergency Department (HOSPITAL_COMMUNITY)

## 2023-09-15 ENCOUNTER — Other Ambulatory Visit: Payer: Self-pay

## 2023-09-15 DIAGNOSIS — S50312A Abrasion of left elbow, initial encounter: Secondary | ICD-10-CM | POA: Insufficient documentation

## 2023-09-15 DIAGNOSIS — E876 Hypokalemia: Secondary | ICD-10-CM | POA: Diagnosis not present

## 2023-09-15 DIAGNOSIS — Z79899 Other long term (current) drug therapy: Secondary | ICD-10-CM | POA: Diagnosis not present

## 2023-09-15 DIAGNOSIS — Y908 Blood alcohol level of 240 mg/100 ml or more: Secondary | ICD-10-CM | POA: Insufficient documentation

## 2023-09-15 DIAGNOSIS — F10920 Alcohol use, unspecified with intoxication, uncomplicated: Secondary | ICD-10-CM | POA: Insufficient documentation

## 2023-09-15 DIAGNOSIS — R519 Headache, unspecified: Secondary | ICD-10-CM | POA: Diagnosis not present

## 2023-09-15 DIAGNOSIS — W19XXXA Unspecified fall, initial encounter: Secondary | ICD-10-CM | POA: Insufficient documentation

## 2023-09-15 DIAGNOSIS — M25552 Pain in left hip: Secondary | ICD-10-CM | POA: Diagnosis not present

## 2023-09-15 LAB — COMPREHENSIVE METABOLIC PANEL WITH GFR
ALT: 15 U/L (ref 0–44)
AST: 25 U/L (ref 15–41)
Albumin: 3.9 g/dL (ref 3.5–5.0)
Alkaline Phosphatase: 63 U/L (ref 38–126)
Anion gap: 11 (ref 5–15)
BUN: 10 mg/dL (ref 8–23)
CO2: 25 mmol/L (ref 22–32)
Calcium: 9.2 mg/dL (ref 8.9–10.3)
Chloride: 106 mmol/L (ref 98–111)
Creatinine, Ser: 0.88 mg/dL (ref 0.44–1.00)
GFR, Estimated: >60 mL/min (ref >60)
Glucose, Bld: 109 mg/dL — ABNORMAL HIGH (ref 70–99)
Potassium: 2.9 mmol/L — ABNORMAL LOW (ref 3.5–5.1)
Sodium: 142 mmol/L (ref 135–145)
Total Bilirubin: 0.9 mg/dL (ref 0.0–1.2)
Total Protein: 7.2 g/dL (ref 6.5–8.1)

## 2023-09-15 LAB — CBC
HCT: 32.6 % — ABNORMAL LOW (ref 36.0–46.0)
Hemoglobin: 10.8 g/dL — ABNORMAL LOW (ref 12.0–15.0)
MCH: 30.3 pg (ref 26.0–34.0)
MCHC: 33.1 g/dL (ref 30.0–36.0)
MCV: 91.6 fL (ref 80.0–100.0)
Platelets: 395 10*3/uL (ref 150–400)
RBC: 3.56 MIL/uL — ABNORMAL LOW (ref 3.87–5.11)
RDW: 12.5 % (ref 11.5–15.5)
WBC: 7.5 10*3/uL (ref 4.0–10.5)
nRBC: 0 % (ref 0.0–0.2)

## 2023-09-15 LAB — ETHANOL: Alcohol, Ethyl (B): 274 mg/dL — ABNORMAL HIGH (ref <15)

## 2023-09-15 LAB — CBG MONITORING, ED: Glucose-Capillary: 104 mg/dL — ABNORMAL HIGH (ref 70–99)

## 2023-09-15 NOTE — ED Triage Notes (Signed)
 Pt BIB EMS found in the road in front of her house, etoh on board. Pt states that she does not want to be here, she's ready to go home.  Cbg 131 98RA 118/78 60HR

## 2023-09-15 NOTE — ED Provider Notes (Signed)
 [place] EMERGENCY DEPARTMENT AT [facility] Provider Note   CSN: [id number] Arrival date & time: 09/15/23  2235     History {Add pertinent medical, surgical, social history, OB history to HPI:1} Chief Complaint  Patient presents with  . Alcohol Intoxication    Kim Bowers is a 83 y.o. female.  83 year old female BIB EMS with alcohol intoxication. Per nursing staff, patient's son called 911 as patient was heavily intoxicated and refused to let her back into the house. Based on patient's level of intoxication it is difficult to get a full history from her, but she does report falling in the street outside of her home this evening, complains of L elbow and L hip pain. She admits to drinking beer this evening, but is unable to quantify how much. It is unclear how she fell or if she struck her head during her fall. On aspirin . She is adamant that she be discharged, as her son has cancer and she feels she needs to return home to him.    Alcohol Intoxication      Home Medications Prior to Admission medications   Medication Sig Start Date End Date Taking? Authorizing Provider  acetaminophen  (TYLENOL ) 325 MG tablet Take 2 tablets (650 mg total) by mouth every 6 (six) hours as needed for mild pain (or Fever >/= 101). 12/17/13   Buriev, Dorsey Gault, MD  amLODipine -benazepril  (LOTREL) 10-20 MG per capsule Take 1 capsule by mouth daily. 12/17/13   Adrienne Horning, MD  aspirin  EC 81 MG tablet Take 1 tablet (81 mg total) by mouth daily. 12/17/13   Adrienne Horning, MD  metFORMIN  (GLUCOPHAGE -XR) 500 MG 24 hr tablet Take 500 mg by mouth daily with breakfast. 12/17/13   Marolyn Sis, Dorsey Gault, MD  metoprolol  succinate (TOPROL -XL) 50 MG 24 hr tablet Take 1 tablet (50 mg total) by mouth daily. Take with or immediately following a meal. 12/17/13   Buriev, Dorsey Gault, MD  simvastatin  (ZOCOR ) 20 MG tablet Take 1 tablet (20 mg total) by mouth daily. 12/17/13   Adrienne Horning, MD      Allergies    Patient  has no known allergies.    Review of Systems   Review of Systems  Physical Exam Updated Vital Signs BP 130/71   Pulse (!) 57   Temp 97.6 F (36.4 C)   Resp 18   Ht 5\' 3"  (1.6 m)   Wt 77 kg   SpO2 100%   BMI 30.07 kg/m  Physical Exam Vitals and nursing note reviewed.  Constitutional:      Comments: Agitated/intoxicated, tearful  HENT:     Head: Normocephalic and atraumatic.  Eyes:     Extraocular Movements: Extraocular movements intact.     Pupils: Pupils are equal, round, and reactive to light.  Cardiovascular:     Rate and Rhythm: Normal rate.  Pulmonary:     Effort: Pulmonary effort is normal.  Abdominal:     Palpations: Abdomen is soft.     Tenderness: There is no abdominal tenderness.  Musculoskeletal:     Cervical back: Normal range of motion.     Comments: Moves all extremities spontaneously without difficulty L elbow: active/passive range of motion in-tact, mild TTP, no effusion/swelling Tenderness to palpation of the L hip/pelvis, no obvious bruising or deformity   Skin:    Comments: ~1cm abrasion to left elbow, not actively bleeding  Psychiatric:        Attention and Perception: She is inattentive.  Speech: Speech is slurred.        Behavior: Behavior is agitated.    ED Results / Procedures / Treatments   Labs (all labs ordered are listed, but only abnormal results are displayed) Labs Reviewed  CBC - Abnormal; Notable for the following components:      Result Value   RBC 3.56 (*)    Hemoglobin 10.8 (*)    HCT 32.6 (*)    All other components within normal limits  CBG MONITORING, ED - Abnormal; Notable for the following components:   Glucose-Capillary 104 (*)    All other components within normal limits  ETHANOL  COMPREHENSIVE METABOLIC PANEL WITH GFR    EKG None  Radiology DG Pelvis Portable Result Date: 09/15/2023 CLINICAL DATA:  Recent fall with pelvic pain, initial encounter EXAM: PORTABLE PELVIS 1-2 VIEWS COMPARISON:  None  Available. FINDINGS: Pelvic ring is intact. Degenerative changes of the hip joints and lumbar spine are noted. No acute fracture is seen. No soft tissue abnormality is noted. IMPRESSION: No acute abnormality noted. Electronically Signed   By: Violeta Grey M.D.   On: 09/15/2023 23:28   DG Femur Min 2 Views Left Result Date: 09/15/2023 CLINICAL DATA:  Recent fall with left leg pain, initial encounter EXAM: LEFT FEMUR 2 VIEWS COMPARISON:  None Available. FINDINGS: No acute fracture or dislocation is noted. Mild degenerative changes in the knee joint and hip joint are seen. No soft tissue abnormality is noted. IMPRESSION: No acute abnormality noted. Electronically Signed   By: Violeta Grey M.D.   On: 09/15/2023 23:26   DG Elbow 2 Views Left Result Date: 09/15/2023 CLINICAL DATA:  Recent fall with elbow pain, initial encounter EXAM: LEFT ELBOW - 2 VIEW COMPARISON:  None Available. FINDINGS: There is no evidence of fracture, dislocation, or joint effusion. There is no evidence of arthropathy or other focal bone abnormality. Soft tissues are unremarkable. IMPRESSION: No acute abnormality noted. Electronically Signed   By: Violeta Grey M.D.   On: 09/15/2023 23:21    Procedures Procedures  {Document cardiac monitor, telemetry assessment procedure when appropriate:1}  Medications Ordered in ED Medications - No data to display  ED Course/ Medical Decision Making/ A&P   {   Click here for ABCD2, HEART and other calculatorsREFRESH Note before signing :1}                              Medical Decision Making This patient presents to the ED for concern of alcohol intoxication and fall, this involves an extensive number of treatment options, and is a complaint that carries with it a high risk of complications and morbidity.  The differential diagnosis includes alcohol intoxication, alcohol use disorder, electrolyte disturbance, fracture, contusion.    Co morbidities that complicate the patient evaluation  On  aspirin ; history of CAD, hypertension, T2DM, dyslipidemia   Additional history obtained:  Additional history obtained from chart review   Lab Tests:  I Ordered, and personally interpreted labs.  The pertinent results include:  CBG of 104. CBC with hemoglobin of 10.8, this is consistent with prior bloodwork 3 years ago. Ethanol 274. CMP notable for potassium of 2.9.   Imaging Studies ordered:  I ordered imaging studies including XR L elbow, XR pelvis, XR L femur, CT head w/o contrast, CT c-spine w/o contrast  I independently visualized and interpreted imaging which showed  - L Elbow: No acute abnormality noted. - Pelvis: Degenerative changes, no acute  abnormality - L femur: Degenerative changes, no acute abnormality  - CT head: - CT C-spine:  I agree with the radiologist interpretation   Cardiac Monitoring: / EKG:  The patient was maintained on a cardiac monitor.  I personally viewed and interpreted the cardiac monitored which showed an underlying rhythm of: ***   Consultations Obtained:  I requested consultation with the ***,  and discussed lab and imaging findings as well as pertinent plan - they recommend: ***   Problem List / ED Course / Critical interventions / Medication management  *** I ordered medication including ***  for ***  Reevaluation of the patient after these medicines showed that the patient {resolved/improved/worsened:23923::"improved"} I have reviewed the patients home medicines and have made adjustments as needed   Social Determinants of Health:  Alcohol use   Test / Admission - Considered:  Difficult to obtain history due to level of intoxication. Physical exam is notable for tenderness to palpation of the L elbow with small abrasion noted, tenderness to palpation of the L hip/pelvis without evidence of bruising/deformity. XR's are reassuring, see above for interpretation. Based on poor history but reported fall, I decided to proceed with CT  head/C-spine imaging in the event that patient struck her head when falling. Ethanol of 247, CMP notable for hypokalemia at 2.9; see above for additional lab results/interpretation. Upon reexamination patient is sleeping comfortably.     Amount and/or Complexity of Data Reviewed Labs: ordered. Radiology: ordered.   ***  {Document critical care time when appropriate:1} {Document review of labs and clinical decision tools ie heart score, Chads2Vasc2 etc:1}  {Document your independent review of radiology images, and any outside records:1} {Document your discussion with family members, caretakers, and with consultants:1} {Document social determinants of health affecting pt's care:1} {Document your decision making why or why not admission, treatments were needed:1} Final Clinical Impression(s) / ED Diagnoses Final diagnoses:  None    Rx / DC Orders ED Discharge Orders     None

## 2023-09-16 ENCOUNTER — Emergency Department (HOSPITAL_COMMUNITY)

## 2023-09-16 DIAGNOSIS — F10920 Alcohol use, unspecified with intoxication, uncomplicated: Secondary | ICD-10-CM | POA: Diagnosis not present

## 2023-09-16 MED ORDER — POTASSIUM CHLORIDE CRYS ER 20 MEQ PO TBCR
40.0000 meq | EXTENDED_RELEASE_TABLET | Freq: Once | ORAL | Status: AC
  Administered 2023-09-16: 40 meq via ORAL
  Filled 2023-09-16: qty 2

## 2023-09-16 NOTE — ED Notes (Signed)
 Pt continuing to remove monitor cords and attempt to get out of bed. Pt redirected

## 2023-09-16 NOTE — Discharge Instructions (Addendum)
 Maintain adequate hydration, avoid excessive alcohol consumption. Return to the Emergency Department if your symptoms worsen. Follow-up with your PCP later this week.

## 2023-12-18 ENCOUNTER — Emergency Department (HOSPITAL_COMMUNITY)
Admission: EM | Admit: 2023-12-18 | Discharge: 2023-12-18 | Disposition: A | Discharge status: Home / Self Care | Attending: Emergency Medicine | Admitting: Emergency Medicine

## 2023-12-18 ENCOUNTER — Emergency Department (HOSPITAL_COMMUNITY)

## 2023-12-18 DIAGNOSIS — M25562 Pain in left knee: Secondary | ICD-10-CM | POA: Diagnosis present

## 2023-12-18 DIAGNOSIS — Z79899 Other long term (current) drug therapy: Secondary | ICD-10-CM | POA: Diagnosis not present

## 2023-12-18 DIAGNOSIS — M1712 Unilateral primary osteoarthritis, left knee: Secondary | ICD-10-CM | POA: Insufficient documentation

## 2023-12-18 DIAGNOSIS — Z7984 Long term (current) use of oral hypoglycemic drugs: Secondary | ICD-10-CM | POA: Insufficient documentation

## 2023-12-18 DIAGNOSIS — I10 Essential (primary) hypertension: Secondary | ICD-10-CM

## 2023-12-18 LAB — CBC WITH DIFFERENTIAL/PLATELET
Abs Immature Granulocytes: 0.02 K/uL (ref 0.00–0.07)
Basophils Absolute: 0.1 K/uL (ref 0.0–0.1)
Basophils Relative: 1 %
Eosinophils Absolute: 0.1 K/uL (ref 0.0–0.5)
Eosinophils Relative: 1 %
HCT: 36.8 % (ref 36.0–46.0)
Hemoglobin: 12 g/dL (ref 12.0–15.0)
Immature Granulocytes: 0 %
Lymphocytes Relative: 23 %
Lymphs Abs: 1.6 K/uL (ref 0.7–4.0)
MCH: 30 pg (ref 26.0–34.0)
MCHC: 32.6 g/dL (ref 30.0–36.0)
MCV: 92 fL (ref 80.0–100.0)
Monocytes Absolute: 0.4 K/uL (ref 0.1–1.0)
Monocytes Relative: 6 %
Neutro Abs: 4.8 K/uL (ref 1.7–7.7)
Neutrophils Relative %: 69 %
Platelets: 343 K/uL (ref 150–400)
RBC: 4 MIL/uL (ref 3.87–5.11)
RDW: 12.4 % (ref 11.5–15.5)
WBC: 7 K/uL (ref 4.0–10.5)
nRBC: 0 % (ref 0.0–0.2)

## 2023-12-18 LAB — BASIC METABOLIC PANEL WITH GFR
Anion gap: 12 (ref 5–15)
BUN: 9 mg/dL (ref 8–23)
CO2: 28 mmol/L (ref 22–32)
Calcium: 9.4 mg/dL (ref 8.9–10.3)
Chloride: 103 mmol/L (ref 98–111)
Creatinine, Ser: 0.72 mg/dL (ref 0.44–1.00)
GFR, Estimated: >60 mL/min (ref >60)
Glucose, Bld: 106 mg/dL — ABNORMAL HIGH (ref 70–99)
Potassium: 3.1 mmol/L — ABNORMAL LOW (ref 3.5–5.1)
Sodium: 143 mmol/L (ref 135–145)

## 2023-12-18 LAB — TROPONIN I (HIGH SENSITIVITY): Troponin I (High Sensitivity): 8 ng/L (ref <18)

## 2023-12-18 MED ORDER — METOPROLOL SUCCINATE ER 25 MG PO TB24
50.0000 mg | ORAL_TABLET | Freq: Every day | ORAL | Status: DC
  Administered 2023-12-18: 50 mg via ORAL
  Filled 2023-12-18: qty 2

## 2023-12-18 MED ORDER — AMLODIPINE BESYLATE 5 MG PO TABS
10.0000 mg | ORAL_TABLET | Freq: Once | ORAL | Status: AC
  Administered 2023-12-18: 10 mg via ORAL
  Filled 2023-12-18: qty 2

## 2023-12-18 MED ORDER — DICLOFENAC SODIUM 1 % EX GEL
4.0000 g | Freq: Once | CUTANEOUS | Status: AC
  Administered 2023-12-18: 4 g via TOPICAL
  Filled 2023-12-18: qty 100

## 2023-12-18 MED ORDER — TRAMADOL HCL 50 MG PO TABS
50.0000 mg | ORAL_TABLET | Freq: Four times a day (QID) | ORAL | 0 refills | Status: AC | PRN
Start: 2023-12-18 — End: ?

## 2023-12-18 MED ORDER — HYDRALAZINE HCL 25 MG PO TABS: 25.0000 mg | ORAL_TABLET | Freq: Two times a day (BID) | ORAL | 0 refills | Status: AC

## 2023-12-18 MED ORDER — MORPHINE SULFATE (PF) 4 MG/ML IV SOLN
4.0000 mg | Freq: Once | INTRAVENOUS | Status: AC
  Administered 2023-12-18: 4 mg via INTRAVENOUS
  Filled 2023-12-18: qty 1

## 2023-12-18 MED ORDER — HYDRALAZINE HCL 20 MG/ML IJ SOLN
10.0000 mg | Freq: Once | INTRAMUSCULAR | Status: AC
  Administered 2023-12-18: 10 mg via INTRAVENOUS
  Filled 2023-12-18: qty 1

## 2023-12-18 NOTE — Discharge Instructions (Addendum)
 You were seen in the emergency department for left knee pain and swelling  The x-ray did not show any abnormalities of the bone  Your pain swelling is most likely due to arthritis.  I have prescribed tramadol  as needed for pain  You can use the topical gel provided (called Voltaren ) as directed to help with the knee pain.  Follow-up with orthopedic doctor regarding your knee  Follow-up with your primary care doctor  Your blood pressure was elevated and I recommend you continue taking your current blood pressure medicines.  I added hydralazine  25 mg twice daily.  You need to see your primary care doctor in a week to recheck your blood pressure.  Return to the emergency department for severe pain or if you are unable to walk

## 2023-12-18 NOTE — ED Provider Notes (Signed)
 [place] EMERGENCY DEPARTMENT AT [facility] Provider Note   CSN: [id number] Arrival date & time: 12/18/23  1002     Patient presents with: bilateral knee pain   Kim Bowers is a 83 y.o. female.  With a history of osteoarthritis who presents to the ED for knee pain.  Increased knee pain and swelling in the left knee over the last 1 to 2 weeks.  Tylenol  at home has been ineffective in providing pain relief.  No inciting injuries or falls.  No fevers chills or redness to this knee.  Right knee has also been more painful recently.   HPI     Prior to Admission medications   Medication Sig Start Date End Date Taking? Authorizing Provider  acetaminophen  (TYLENOL ) 325 MG tablet Take 2 tablets (650 mg total) by mouth every 6 (six) hours as needed for mild pain (or Fever >/= 101). 12/17/13   Buriev, Algis SAILOR, MD  amLODipine -benazepril  (LOTREL) 10-20 MG per capsule Take 1 capsule by mouth daily. 12/17/13   Cheryl Algis SAILOR, MD  aspirin  EC 81 MG tablet Take 1 tablet (81 mg total) by mouth daily. 12/17/13   Cheryl Algis SAILOR, MD  metFORMIN  (GLUCOPHAGE -XR) 500 MG 24 hr tablet Take 500 mg by mouth daily with breakfast. 12/17/13   Cheryl, Algis SAILOR, MD  metoprolol  succinate (TOPROL -XL) 50 MG 24 hr tablet Take 1 tablet (50 mg total) by mouth daily. Take with or immediately following a meal. 12/17/13   Buriev, Algis SAILOR, MD  simvastatin  (ZOCOR ) 20 MG tablet Take 1 tablet (20 mg total) by mouth daily. 12/17/13   Cheryl Algis SAILOR, MD    Allergies: Patient has no known allergies.    Review of Systems  Updated Vital Signs BP (!) 194/81   Pulse 65   Temp 97.7 F (36.5 C)   Resp 18   Ht 5' 3 (1.6 m)   Wt 69.4 kg   SpO2 99%   BMI 27.10 kg/m   Physical Exam Vitals and nursing note reviewed.  HENT:     Head: Normocephalic and atraumatic.  Eyes:     Pupils: Pupils are equal, round, and reactive to light.  Cardiovascular:     Rate and Rhythm: Normal rate and regular rhythm.  Pulmonary:      Effort: Pulmonary effort is normal.     Breath sounds: Normal breath sounds.  Abdominal:     Palpations: Abdomen is soft.     Tenderness: There is no abdominal tenderness.  Musculoskeletal:     Comments: Full active range of motion with flexion of both knees Mild edema of left knee Mild tenderness to palpation of anterior left knee No erythema or increased warmth  Skin:    General: Skin is warm and dry.  Neurological:     Mental Status: She is alert.  Psychiatric:        Mood and Affect: Mood normal.     (all labs ordered are listed, but only abnormal results are displayed) Labs Reviewed  CBC WITH DIFFERENTIAL/PLATELET  BASIC METABOLIC PANEL WITH GFR  TROPONIN I (HIGH SENSITIVITY)    EKG: None  Radiology: DG Knee 2 Views Left Result Date: 12/18/2023 CLINICAL DATA:  Left knee pain and swelling. EXAM: LEFT KNEE - 1-2 VIEW COMPARISON:  None Available. FINDINGS: There is a moderate joint effusion.  No fracture. IMPRESSION: Moderate joint effusion. Electronically Signed   By: Newell Eke M.D.   On: 12/18/2023 12:35     Procedures   Medications  Ordered in the ED  metoprolol  succinate (TOPROL -XL) 24 hr tablet 50 mg (50 mg Oral Given 12/18/23 1224)  amLODipine  (NORVASC ) tablet 10 mg (10 mg Oral Given 12/18/23 1225)  diclofenac  Sodium (VOLTAREN ) 1 % topical gel 4 g (4 g Topical Given 12/18/23 1338)  hydrALAZINE  (APRESOLINE ) injection 10 mg (10 mg Intravenous Given 12/18/23 1541)  morphine  (PF) 4 MG/ML injection 4 mg (4 mg Intravenous Given 12/18/23 1541)    Clinical Course as of 12/18/23 1617  Sat Dec 18, 2023  1300 X-ray of the left knee shows moderate effusion no osseous abnormality.  Patient significantly hypertensive here but did not take her morning medications.  Will give her her home dose of morning antihypertensive regimen and continue to monitor blood pressure [MP]  1530 Patient is persistently hypertensive here.  Will need better blood pressure control prior to discharge.   Gave signout for further treatment and reevaluation of blood pressure and disposition [MP]    Clinical Course User Index [MP] Pamella Ozell LABOR, DO                                 Medical Decision Making 83 year old female with history as above presents to the ED given concern for subacute knee pain.  Does have a history of osteoarthritis.  No new injuries.  No overlying redness.  Overall benign exam.  This most likely due to chronic osteoarthritis.  Will obtain left knee x-ray to look for any osseous changes and instruct for symptomatic management with orthopedic follow-up.  Amount and/or Complexity of Data Reviewed Labs: ordered. Radiology: ordered. ECG/medicine tests: ordered.  Risk Prescription drug management.        Final diagnoses:  Osteoarthritis of left knee, unspecified osteoarthritis type    ED Discharge Orders     None          Pamella Ozell LABOR, DO 12/18/23 1617

## 2023-12-18 NOTE — ED Provider Notes (Signed)
  Physical Exam  BP (!) 158/77   Pulse 74   Temp 97.7 F (36.5 C)   Resp 18   Ht 5' 3 (1.6 m)   Wt 69.4 kg   SpO2 100%   BMI 27.10 kg/m   Physical Exam  Procedures  Procedures  ED Course / MDM   Clinical Course as of 12/18/23 1840  Sat Dec 18, 2023  1300 X-ray of the left knee shows moderate effusion no osseous abnormality.  Patient significantly hypertensive here but did not take her morning medications.  Will give her her home dose of morning antihypertensive regimen and continue to monitor blood pressure [MP]  1530 Patient is persistently hypertensive here.  Will need better blood pressure control prior to discharge.  Gave signout for further treatment and reevaluation of blood pressure and disposition [MP]    Clinical Course User Index [MP] Kim Bowers LABOR, DO   Medical Decision Making Care assumed at 3 PM.  Patient is here with knee pain.  Patient was noted to be hypertensive with blood pressure over 200.  Patient is compliant with her BP meds.  Unfortunately even after given oral BP meds, blood pressure remained elevated around 190.  I ordered basic blood work and IV hydralazine .  Will reassess  6:41 PM Reviewed patient's labs and they were unremarkable.  Blood pressure down to 158/77 after hydralazine .  Will discharge home hydralazine  twice daily.  Will have her continue her current BP meds.  Will also have patient follow-up with Ortho outpatient regarding her knee arthritis  Problems Addressed: Hypertension, unspecified type: chronic illness or injury Osteoarthritis of left knee, unspecified osteoarthritis type: acute illness or injury  Amount and/or Complexity of Data Reviewed Labs: ordered. Radiology: ordered. ECG/medicine tests: ordered.  Risk Prescription drug management.          Patt Alm Macho, MD 12/18/23 [phone]

## 2023-12-18 NOTE — ED Notes (Signed)
 Help patient use the bedpan patient is resting with call bell in reach

## 2023-12-18 NOTE — ED Triage Notes (Signed)
 Pt reports left knee pain and swelling and she states her right knee is also starting to cause her problems.

## 2024-01-08 ENCOUNTER — Emergency Department (HOSPITAL_COMMUNITY)

## 2024-01-08 ENCOUNTER — Other Ambulatory Visit: Payer: Self-pay

## 2024-01-08 ENCOUNTER — Emergency Department (HOSPITAL_COMMUNITY)
Admission: EM | Admit: 2024-01-08 | Discharge: 2024-01-08 | Disposition: A | Discharge status: Home / Self Care | Attending: Emergency Medicine | Admitting: Emergency Medicine

## 2024-01-08 ENCOUNTER — Encounter (HOSPITAL_COMMUNITY): Payer: Self-pay

## 2024-01-08 DIAGNOSIS — M25562 Pain in left knee: Secondary | ICD-10-CM | POA: Insufficient documentation

## 2024-01-08 DIAGNOSIS — R609 Edema, unspecified: Secondary | ICD-10-CM | POA: Diagnosis not present

## 2024-01-08 DIAGNOSIS — G8929 Other chronic pain: Secondary | ICD-10-CM | POA: Diagnosis not present

## 2024-01-08 DIAGNOSIS — Z7982 Long term (current) use of aspirin: Secondary | ICD-10-CM | POA: Insufficient documentation

## 2024-01-08 MED ORDER — ACETAMINOPHEN 500 MG PO TABS
1000.0000 mg | ORAL_TABLET | Freq: Once | ORAL | Status: AC
  Administered 2024-01-08: 1000 mg via ORAL
  Filled 2024-01-08: qty 2

## 2024-01-08 NOTE — ED Triage Notes (Addendum)
 Pt c.o left knee pain with swelling and warmth that radiates down to her calf, no hx of DVT. Pt here on 8/9 with negative xray

## 2024-01-08 NOTE — ED Triage Notes (Signed)
 PT to ED c/o L knee pain and swelling.  Hx of chronic knee pain.

## 2024-01-08 NOTE — Progress Notes (Signed)
 VASCULAR LAB    Left lower extremity venous duplex has been performed.  See CV proc for preliminary results.   Batya Citron, RVT 01/08/2024, 5:53 PM

## 2024-01-08 NOTE — ED Provider Notes (Signed)
 [place] EMERGENCY DEPARTMENT AT [facility] Provider Note   CSN: [id number] Arrival date & time: 01/08/24  1418     Patient presents with: Knee Pain   Kim Bowers is a 83 y.o. female.   83 year old female with prior medical history as detailed below presents for evaluation.  Patient complains of chronic pain to the left knee.  This has been an ongoing issue for at least a month.  She was seen here on the ninth of this month.  She was diagnosed with likely osteoarthritis of the knee.  She reports that her pain has not improved.  She denies any recent injury.  She denies fever.  She reports the pain radiates from her knee down into her posterior left calf.  She denies fever.  She reports that she has not seen her PCP or orthopedics for this chronic problem.  She reports that the tramadol  that was prescribed on her last visit is ineffective at treating her pain.  The history is provided by the patient and medical records.       Prior to Admission medications   Medication Sig Start Date End Date Taking? Authorizing Provider  acetaminophen  (TYLENOL ) 325 MG tablet Take 2 tablets (650 mg total) by mouth every 6 (six) hours as needed for mild pain (or Fever >/= 101). 12/17/13   Buriev, Algis SAILOR, MD  amLODipine -benazepril  (LOTREL) 10-20 MG per capsule Take 1 capsule by mouth daily. 12/17/13   Cheryl Algis SAILOR, MD  aspirin  EC 81 MG tablet Take 1 tablet (81 mg total) by mouth daily. 12/17/13   Cheryl Algis SAILOR, MD  hydrALAZINE  (APRESOLINE ) 25 MG tablet Take 1 tablet (25 mg total) by mouth in the morning and at bedtime. 12/18/23   Patt Alm Macho, MD  metFORMIN  (GLUCOPHAGE -XR) 500 MG 24 hr tablet Take 500 mg by mouth daily with breakfast. 12/17/13   Cheryl Algis SAILOR, MD  metoprolol  succinate (TOPROL -XL) 50 MG 24 hr tablet Take 1 tablet (50 mg total) by mouth daily. Take with or immediately following a meal. 12/17/13   Buriev, Algis SAILOR, MD  simvastatin  (ZOCOR ) 20 MG tablet Take 1 tablet  (20 mg total) by mouth daily. 12/17/13   Cheryl Algis SAILOR, MD  traMADol  (ULTRAM ) 50 MG tablet Take 1 tablet (50 mg total) by mouth every 6 (six) hours as needed. 12/18/23   Patt Alm Macho, MD    Allergies: Patient has no known allergies.    Review of Systems  All other systems reviewed and are negative.   Updated Vital Signs BP (!) 162/74 (BP Location: Right Arm)   Pulse 66   Temp 97.9 F (36.6 C)   Resp 17   Ht 5' 3 (1.6 m)   Wt 69.4 kg   SpO2 98%   BMI 27.10 kg/m   Physical Exam Vitals and nursing note reviewed.  Constitutional:      General: She is not in acute distress.    Appearance: Normal appearance. She is well-developed.  HENT:     Head: Normocephalic and atraumatic.  Eyes:     Conjunctiva/sclera: Conjunctivae normal.     Pupils: Pupils are equal, round, and reactive to light.  Cardiovascular:     Rate and Rhythm: Normal rate and regular rhythm.     Heart sounds: Normal heart sounds.  Pulmonary:     Effort: Pulmonary effort is normal. No respiratory distress.     Breath sounds: Normal breath sounds.  Abdominal:     General: There is no  distension.     Palpations: Abdomen is soft.     Tenderness: There is no abdominal tenderness.  Musculoskeletal:        General: No deformity. Normal range of motion.     Cervical back: Normal range of motion and neck supple.     Comments: Patient localizes her pain to the anterior aspect of the left knee.  No appreciable erythema, edema, effusion noted.  See images below.  Distal bilateral lower extremities are neurovascular intact.  Skin:    General: Skin is warm and dry.  Neurological:     General: No focal deficit present.     Mental Status: She is alert and oriented to person, place, and time.     (all labs ordered are listed, but only abnormal results are displayed) Labs Reviewed - No data to display  EKG: None  Radiology: No results found.   Procedures   Medications Ordered in the ED  acetaminophen   (TYLENOL ) tablet 1,000 mg (has no administration in time range)                                    Medical Decision Making Patient with a chronic left knee pain.    History and exam are not suggestive of acute pathology.  Imaging suggest chronic pain secondary to osteoarthritis.  DVT study is negative.  Patient is educated that close follow-up with her PCP and also with orthopedics would be very beneficial for her.  Importance of close follow-up stressed.  Strict return precautions given and understood.  Amount and/or Complexity of Data Reviewed Radiology: ordered.  Risk OTC drugs.        Final diagnoses:  Chronic pain of left knee    ED Discharge Orders     None          Laurice Maude BROCKS, MD 01/08/24 [phone]

## 2024-01-08 NOTE — Discharge Instructions (Signed)
 Return for any problem.   Please follow-up with orthopedics as instructed.  They have multiple options available to treat arthritis in the knee.

## 2024-03-13 ENCOUNTER — Emergency Department (HOSPITAL_COMMUNITY)
Admission: EM | Admit: 2024-03-13 | Discharge: 2024-03-13 | Disposition: A | Discharge status: Home / Self Care | Attending: Emergency Medicine | Admitting: Emergency Medicine

## 2024-03-13 DIAGNOSIS — K612 Anorectal abscess: Secondary | ICD-10-CM | POA: Diagnosis not present

## 2024-03-13 DIAGNOSIS — Z7982 Long term (current) use of aspirin: Secondary | ICD-10-CM | POA: Insufficient documentation

## 2024-03-13 DIAGNOSIS — L292 Pruritus vulvae: Secondary | ICD-10-CM | POA: Diagnosis present

## 2024-03-13 LAB — URINALYSIS, ROUTINE W REFLEX MICROSCOPIC
Bilirubin Urine: NEGATIVE
Glucose, UA: NEGATIVE mg/dL
Hgb urine dipstick: NEGATIVE
Ketones, ur: NEGATIVE mg/dL
Leukocytes,Ua: NEGATIVE
Nitrite: NEGATIVE
Protein, ur: NEGATIVE mg/dL
Specific Gravity, Urine: 1.005 (ref 1.005–1.030)
pH: 6 (ref 5.0–8.0)

## 2024-03-13 LAB — HIV ANTIBODY (ROUTINE TESTING W REFLEX): HIV Screen 4th Generation wRfx: NONREACTIVE

## 2024-03-13 LAB — WET PREP, GENITAL
Clue Cells Wet Prep HPF POC: NONE SEEN
Sperm: NONE SEEN
Trich, Wet Prep: NONE SEEN
WBC, Wet Prep HPF POC: >=10 — AB (ref <10)
Yeast Wet Prep HPF POC: NONE SEEN

## 2024-03-13 NOTE — ED Triage Notes (Signed)
 C/o vaginal itching and and lump on her labia. First noticed the lump yesterday. Denies any discharge.

## 2024-03-13 NOTE — Discharge Instructions (Addendum)
 I believe the small bump is a small abscess.  I would recommend doing warm compresses and following up with women's health for further management.  Please call them today or tomorrow to schedule an appointment.  Your results will result in the next couple days and will be available on your MyChart or you can call for results.  We will reach out if there are positive results.  If symptoms worsen or you have new concerns please return to the ED for further evaluation.

## 2024-03-13 NOTE — ED Provider Notes (Signed)
 [place] EMERGENCY DEPARTMENT AT [facility] Provider Note   CSN: [id number] Arrival date & time: 03/13/24  1222     Patient presents with: Vaginal Itching   Kim Bowers is a 83 y.o. female.  83 year old female presents to ED with complaints of bump on her genitals presenting yesterday with some itching.  Patient advises last time she had intercourse was in October of this year and she is sexually active with 1 partner.  Patient reports she is concerned for STI and advises that she does not know the status of her partner but she has never had STI in the past.  Patient does not have any dyspareunia.  Patient reports she does not recall when her last pelvic exam or Pap smear was but it was several years ago.  Patient denies any urinary symptoms or discharge.  Patient denies any other symptoms.     Prior to Admission medications   Medication Sig Start Date End Date Taking? Authorizing Provider  acetaminophen  (TYLENOL ) 325 MG tablet Take 2 tablets (650 mg total) by mouth every 6 (six) hours as needed for mild pain (or Fever >/= 101). 12/17/13   Buriev, Algis SAILOR, MD  amLODipine -benazepril  (LOTREL) 10-20 MG per capsule Take 1 capsule by mouth daily. 12/17/13   Cheryl Algis SAILOR, MD  aspirin  EC 81 MG tablet Take 1 tablet (81 mg total) by mouth daily. 12/17/13   Cheryl Algis SAILOR, MD  hydrALAZINE  (APRESOLINE ) 25 MG tablet Take 1 tablet (25 mg total) by mouth in the morning and at bedtime. 12/18/23   Patt Alm Macho, MD  metFORMIN  (GLUCOPHAGE -XR) 500 MG 24 hr tablet Take 500 mg by mouth daily with breakfast. 12/17/13   Cheryl Algis SAILOR, MD  metoprolol  succinate (TOPROL -XL) 50 MG 24 hr tablet Take 1 tablet (50 mg total) by mouth daily. Take with or immediately following a meal. 12/17/13   Buriev, Algis SAILOR, MD  simvastatin  (ZOCOR ) 20 MG tablet Take 1 tablet (20 mg total) by mouth daily. 12/17/13   Cheryl Algis SAILOR, MD  traMADol  (ULTRAM ) 50 MG tablet Take 1 tablet (50 mg total) by mouth every 6  (six) hours as needed. 12/18/23   Patt Alm Macho, MD    Allergies: Patient has no known allergies.    Review of Systems  Skin:  Positive for rash.  All other systems reviewed and are negative.   Updated Vital Signs BP (!) 148/80   Pulse 68   Temp 97.8 F (36.6 C) (Oral)   Resp 16   SpO2 100%   Physical Exam Vitals and nursing note reviewed. Exam conducted with a chaperone present.  Constitutional:      Appearance: Normal appearance.  HENT:     Head: Normocephalic and atraumatic.     Nose: Nose normal.  Eyes:     Extraocular Movements: Extraocular movements intact.     Conjunctiva/sclera: Conjunctivae normal.     Pupils: Pupils are equal, round, and reactive to light.  Cardiovascular:     Rate and Rhythm: Normal rate.  Pulmonary:     Effort: Pulmonary effort is normal. No respiratory distress.     Breath sounds: Normal breath sounds.  Abdominal:     General: Abdomen is flat.     Tenderness: There is no abdominal tenderness. There is no guarding.  Genitourinary:    General: Normal vulva.     Labia:        Right: No rash or tenderness.        Left: No  rash or tenderness.       Comments: [place] paramedic was chaperone during entire pelvic exam.  No noted discharge or abnormalities noted.  Very small potential abscess noted to the right lower portion outside of the labia.  No redness or swelling noted potentially could be a ingrown hair but at this time not significant enough to attempt to drain.  Patient had very mild pain reported to palpation. Musculoskeletal:        General: Normal range of motion.     Cervical back: Normal range of motion.  Skin:    General: Skin is warm.     Capillary Refill: Capillary refill takes less than 2 seconds.  Neurological:     General: No focal deficit present.     Mental Status: She is alert.  Psychiatric:        Mood and Affect: Mood normal.        Behavior: Behavior normal.     (all labs ordered are listed, but only  abnormal results are displayed) Labs Reviewed  URINALYSIS, ROUTINE W REFLEX MICROSCOPIC - Abnormal; Notable for the following components:      Result Value   Color, Urine STRAW (*)    All other components within normal limits  WET PREP, GENITAL  HIV ANTIBODY (ROUTINE TESTING W REFLEX)  GC/CHLAMYDIA PROBE AMP ([place]) NOT AT [facility]    EKG: None  Radiology: No results found.   .Pelvic exam  Date/Time: 03/13/2024 3:35 PM  Performed by: Myriam Fonda RAMAN, PA-C Authorized by: Myriam Fonda RAMAN, PA-C  Consent: Verbal consent obtained Risks and benefits: risks, benefits and alternatives were discussed Consent given by: patient Patient understanding: patient states understanding of the procedure being performed Patient consent: the patient's understanding of the procedure matches consent given Procedure consent: procedure consent matches procedure scheduled Relevant documents: relevant documents present and verified Patient identity confirmed: verbally with patient and arm band Time out: Immediately prior to procedure a time out was called to verify the correct patient, procedure, equipment, support staff and site/side marked as required. Preparation: Patient was prepped and draped in the usual sterile fashion. Local anesthesia used: no  Anesthesia: Local anesthesia used: no  Sedation: Patient sedated: no  Patient tolerance: patient tolerated the procedure well with no immediate complications Comments: No complications during pelvic exam and [place] paramedic was chaperone during entire exam.      Medications Ordered in the ED - No data to display  83 y.o. female presents to the ED with complaints of lesion on genitalia x 1 day, this involves an extensive number of treatment options, and is a complaint that carries with it a high risk of complications and morbidity.  The differential diagnosis includes abscess, herpes, syphilis, chlamydia gonorrhea (Ddx)  On arrival pt is  nontoxic, vitals unremarkable. Exam significant for very small abscess noted on the right inferior portion right outside of the labia.  Lab Tests:  I Ordered, reviewed, and interpreted labs, which included: UA, HIV, GC chlamydia, wet prep.  Patient was advised she will be notified of positive results and notified she is able to call and find results on MyChart.   ED Course:   Patient sitting comfortably in ED bed in no acute distress nontoxic-appearing.  Patient was advised we will need to do a pelvic exam for further evaluation of reported lesion along the genital area.  Patient agreed to pelvic exam.   Patient was advised of process of pelvic exam and agreed.  Very small abscess noted  on the right lower portion outside of the labia.  Abscess does not appear significant enough to drain and patient was advised of outpatient management and close follow-up.  Patient was advised to use warm compresses and follow-up with women's health.  Patient was given with womens health referral and advised to call today to make an appointment.  Patient agreed to strict return precautions and agreed to treatment plan.  Portions of this note were generated with Scientist, clinical (histocompatibility and immunogenetics). Dictation errors may occur despite best attempts at proofreading.   Final diagnoses:  Abscess of anal and rectal regions    ED Discharge Orders     None          Myriam Fonda GORMAN DEVONNA 03/13/24 1603    Charlyn Sora, MD 03/14/24 1807

## 2024-03-14 LAB — GC/CHLAMYDIA PROBE AMP (~~LOC~~) NOT AT ARMC
Chlamydia: NEGATIVE
Comment: NEGATIVE
Comment: NORMAL
Neisseria Gonorrhea: NEGATIVE

## 2024-04-17 ENCOUNTER — Ambulatory Visit: Admitting: Obstetrics and Gynecology

## 2024-04-17 VITALS — BP 175/76 | HR 60 | Ht 63.0 in | Wt 157.8 lb

## 2024-04-17 DIAGNOSIS — N764 Abscess of vulva: Secondary | ICD-10-CM | POA: Diagnosis not present

## 2024-04-17 NOTE — Progress Notes (Unsigned)
   NEW GYNECOLOGY PATIENT Patient name: Kim Bowers MRN [id number]  Date of birth: Jan 14, 1941 Chief Complaint:   No chief complaint on file.    - appears to have resolved, no longer having pain. Concerned about possible return Initially had a bump on the right side, which improved with a warm compress. Did not take any medication. Had a bump appar on the other side which improved with warm rag. Last noticed a bump earlier this morning. Does not engage in hair removal. Has been using different soaps no vaginal disarge. Has vaginal moisture. Denies itching but has burning when the bump was present. Very small bump. Does not think there was any discharge from the bump. Intermittently sexually adolm is History:  Discussed the use of AI scribe software for clinical note transcription with the patient, who gave verbal consent to proceed.  History of Present Illness           Gynecologic History No LMP recorded. Patient has had a hysterectomy. Contraception: {method:5051}   OB History  No obstetric history on file.     The following portions of the patient's history were reviewed and updated as appropriate: allergies, current medications, past family history, past medical history, past social history, past surgical history and problem list. Health Maintenance  Topic Date Due  . Medicare Annual Wellness Visit  Never done  . Complete foot exam   Never done  . Eye exam for diabetics  Never done  . Yearly kidney health urinalysis for diabetes  Never done  . DTaP/Tdap/Td vaccine (1 - Tdap) Never done  . Pneumococcal Vaccine for age over 22 (1 of 2 - PCV) Never done  . Zoster (Shingles) Vaccine (1 of 2) Never done  . Hemoglobin A1C  06/18/2014  . Flu Shot  Never done  . COVID-19 Vaccine (1 - 2025-26 season) Never done  . Yearly kidney function blood test for diabetes  12/17/2024  . Osteoporosis screening with Bone Density Scan  Completed  . Meningitis B Vaccine  Aged Out      Review of Systems Pertinent items noted in HPI and remainder of comprehensive ROS otherwise negative.  Physical Exam:  BP (!) 175/76 (BP Location: Right Arm, Patient Position: Sitting, Cuff Size: Normal)   Pulse 60   Ht 5' 3 (1.6 m)   Wt 157 lb 12.8 oz (71.6 kg)   BMI 27.95 kg/m  Physical Exam     Assessment and Plan:  Assessment and Plan Assessment & Plan        Follow-up: No follow-ups on file.      Carter Quarry, MD Obstetrician & Gynecologist, Faculty Practice [facility] for [employer], [facility]

## 2024-06-02 ENCOUNTER — Emergency Department (HOSPITAL_COMMUNITY)
Admission: EM | Admit: 2024-06-02 | Discharge: 2024-06-02 | Disposition: A | Discharge status: Home / Self Care | Attending: Emergency Medicine | Admitting: Emergency Medicine

## 2024-06-02 DIAGNOSIS — Z7982 Long term (current) use of aspirin: Secondary | ICD-10-CM | POA: Insufficient documentation

## 2024-06-02 DIAGNOSIS — N898 Other specified noninflammatory disorders of vagina: Secondary | ICD-10-CM | POA: Insufficient documentation

## 2024-06-02 LAB — WET PREP, GENITAL
Clue Cells Wet Prep HPF POC: NONE SEEN
Sperm: NONE SEEN
Trich, Wet Prep: NONE SEEN
WBC, Wet Prep HPF POC: <10
Yeast Wet Prep HPF POC: NONE SEEN

## 2024-06-02 LAB — URINALYSIS, ROUTINE W REFLEX MICROSCOPIC
Bilirubin Urine: NEGATIVE
Glucose, UA: NEGATIVE mg/dL
Hgb urine dipstick: NEGATIVE
Ketones, ur: NEGATIVE mg/dL
Leukocytes,Ua: NEGATIVE
Nitrite: NEGATIVE
Protein, ur: NEGATIVE mg/dL
Specific Gravity, Urine: 1.006 (ref 1.005–1.030)
pH: 7 (ref 5.0–8.0)

## 2024-06-02 NOTE — ED Provider Notes (Signed)
 " [place] EMERGENCY DEPARTMENT AT [facility] Provider Note   CSN: [id number] Arrival date & time: 06/02/24  1612     Patient presents with: Vaginal Itching and Exposure to STD   Kim Bowers is a 84 y.o. female.    Vaginal Itching  Exposure to STD  84 year old female presenting with vaginal itchiness.  Patient reports that this been going on for about a month now.  Patient reports that her sexual partner recently told her that he had a infection.  She also noted that last time she had a pelvic exam she had some bumps on the outside of her vagina and did not know if this had gone away either.  She denies any bleeding or discharge.  She denies any pelvic pain.     Prior to Admission medications  Medication Sig Start Date End Date Taking? Authorizing Provider  acetaminophen  (TYLENOL ) 325 MG tablet Take 2 tablets (650 mg total) by mouth every 6 (six) hours as needed for mild pain (or Fever >/= 101). 12/17/13   Buriev, Algis SAILOR, MD  amLODipine -benazepril  (LOTREL) 10-20 MG per capsule Take 1 capsule by mouth daily. 12/17/13   Cheryl Algis SAILOR, MD  aspirin  EC 81 MG tablet Take 1 tablet (81 mg total) by mouth daily. 12/17/13   Cheryl Algis SAILOR, MD  hydrALAZINE  (APRESOLINE ) 25 MG tablet Take 1 tablet (25 mg total) by mouth in the morning and at bedtime. 12/18/23   Patt Alm Macho, MD  metFORMIN  (GLUCOPHAGE -XR) 500 MG 24 hr tablet Take 500 mg by mouth daily with breakfast. 12/17/13   Cheryl Algis SAILOR, MD  metoprolol  succinate (TOPROL -XL) 50 MG 24 hr tablet Take 1 tablet (50 mg total) by mouth daily. Take with or immediately following a meal. 12/17/13   Buriev, Algis SAILOR, MD  simvastatin  (ZOCOR ) 20 MG tablet Take 1 tablet (20 mg total) by mouth daily. 12/17/13   Cheryl Algis SAILOR, MD  traMADol  (ULTRAM ) 50 MG tablet Take 1 tablet (50 mg total) by mouth every 6 (six) hours as needed. 12/18/23   Patt Alm Macho, MD    Allergies: Patient has no known allergies.    Review of Systems  All  other systems reviewed and are negative.   Updated Vital Signs BP (!) 175/107 (BP Location: Right Arm)   Pulse 66   Temp 98 F (36.7 C) (Oral)   Resp 18   SpO2 100%   Physical Exam Vitals and nursing note reviewed. Exam conducted with a chaperone present.  HENT:     Mouth/Throat:     Pharynx: Oropharynx is clear.  Cardiovascular:     Rate and Rhythm: Normal rate.     Pulses: Normal pulses.  Pulmonary:     Effort: Pulmonary effort is normal.     Breath sounds: Normal breath sounds.  Abdominal:     General: Abdomen is flat. Bowel sounds are normal.     Palpations: Abdomen is soft.  Genitourinary:    General: Normal vulva.     Exam position: Knee-chest position.     Pubic Area: No rash.      Vagina: Vaginal discharge present.     Cervix: Normal.     Uterus: Normal.      Comments: Some whitish discharge noticed around the cervix and in the vaginal canal.  No signs of bleeding.  No signs of trauma. Skin:    General: Skin is warm and dry.  Neurological:     General: No focal deficit present.  Mental Status: She is alert.     (all labs ordered are listed, but only abnormal results are displayed) Labs Reviewed - No data to display  EKG: None  Radiology: No results found.   Procedures   Medications Ordered in the ED - No data to display                                  Medical Decision Making  Impression: 84 year old female presenting with vaginal itching.  Differential diagnose include yeast infection, bacterial vaginosis, chlamydia, gonorrhea, syphilis  Additional History: Patient was able to provide history.  Also reviewed other outpatient notes.  Labs: Urinalysis showed no acute abnormalities.  Wet prep showed no acute abnormalities.  Chlamydia and gonorrhea were collected.  Syphilis testing was collected and sent out.  Imaging: None  ED Course/Meds: 84 year old female presenting with vaginal itchiness.  Patient was well-appearing and in no acute  distress.  Patient reports that this has been going on for about 2 weeks now.  Was recently seen at her OB/GYN where she was found to have a small cyst which has improved since using hot compress.  Patient reports that her sexual partner told her that he has infections however she does not know which kind.  When asked if she wanted to be tested for STIs and have a pelvic exam she reported yes.  A small amount of whitish colored discharge was noted in the vaginal canal.  No abnormalities noted to the cervix.  Wet mount and urinalysis were within normal limits.  I talked to the patient how it would be a day or 2 before she got her chlamydia and RPR results back.  She understood this educated the patient on how the infections are transmitted.  Told patient to follow back up with her OB/GYN if the symptoms continue.  Patient agreed to the plan stated above.  Educated on signs and symptoms when to return to the ER.  Patient remained stable in the ER and at discharge.      Final diagnoses:  None    ED Discharge Orders     None          Kim Bowers 06/02/24 1950    Mannie Pac T, DO 06/02/24 2327  "

## 2024-06-02 NOTE — ED Triage Notes (Addendum)
 Patient reports vaginal itching for 1 month. Denies dysuria/abdominal pain. Patient is alert and oriented x 4. Airway patent, respirations even and unlabored. Skin normal, warm and dry. Patient had recent sexual encounter with someone with infection.

## 2024-06-02 NOTE — Discharge Instructions (Addendum)
 You were seen in the ER today for vaginal itching.  Your test for trichomonas and bacterial vaginosis were negative.  Your urinalysis showed no signs of urinary tract infections.  You are waiting for your test results to come back for chlamydia, gonorrhea, syphilis.  You will be called in the next few days with these test results.  I want you to follow-up with your primary care if the symptoms continue.  If your symptoms begin to worsen or you start to develop any new symptoms please return to the ER.

## 2024-06-03 LAB — MISC LABCORP TEST (SEND OUT)
LabCorp test name: 83935
Labcorp test code: 83935

## 2024-06-03 LAB — SYPHILIS: RPR W/REFLEX TO RPR TITER AND TREPONEMAL ANTIBODIES, TRADITIONAL SCREENING AND DIAGNOSIS ALGORITHM: RPR Ser Ql: NONREACTIVE

## 2024-06-05 LAB — GC/CHLAMYDIA PROBE AMP (~~LOC~~) NOT AT ARMC
Chlamydia: NEGATIVE
Comment: NEGATIVE
Comment: NORMAL
Neisseria Gonorrhea: NEGATIVE

## 2024-06-06 ENCOUNTER — Telehealth (HOSPITAL_COMMUNITY): Payer: Self-pay

## 2024-06-06 NOTE — Telephone Encounter (Signed)
 Contacted pt. (Returned her call).  She called for her ED visit 06/02/2024 results STI. All results are negative.  Pt. Did not have any further questions.
